# Patient Record
Sex: Male | Born: 1970 | Race: White | Hispanic: No | Marital: Married | Smoking: Never smoker
Health system: Southern US, Community
[De-identification: ages and names within clinical notes are randomized; demographics above are authoritative.]

## PROBLEM LIST (undated history)

## (undated) DIAGNOSIS — I10 Essential (primary) hypertension: Secondary | ICD-10-CM

## (undated) DIAGNOSIS — F419 Anxiety disorder, unspecified: Secondary | ICD-10-CM

## (undated) HISTORY — DX: Essential (primary) hypertension: I10

## (undated) HISTORY — DX: Anxiety disorder, unspecified: F41.9

---

## 2017-04-13 DIAGNOSIS — E669 Obesity, unspecified: Secondary | ICD-10-CM | POA: Insufficient documentation

## 2017-04-13 DIAGNOSIS — I10 Essential (primary) hypertension: Secondary | ICD-10-CM | POA: Insufficient documentation

## 2017-07-03 DIAGNOSIS — F4329 Adjustment disorder with other symptoms: Secondary | ICD-10-CM | POA: Insufficient documentation

## 2018-03-16 ENCOUNTER — Ambulatory Visit (INDEPENDENT_AMBULATORY_CARE_PROVIDER_SITE_OTHER): Payer: 59 | Admitting: Osteopathic Medicine

## 2018-03-16 ENCOUNTER — Encounter: Payer: Self-pay | Admitting: Osteopathic Medicine

## 2018-03-16 VITALS — BP 129/79 | HR 70 | Temp 98.3°F | Ht 73.0 in | Wt 275.0 lb

## 2018-03-16 DIAGNOSIS — I10 Essential (primary) hypertension: Secondary | ICD-10-CM | POA: Diagnosis not present

## 2018-03-16 DIAGNOSIS — F419 Anxiety disorder, unspecified: Secondary | ICD-10-CM | POA: Diagnosis not present

## 2018-03-16 DIAGNOSIS — B078 Other viral warts: Secondary | ICD-10-CM | POA: Diagnosis not present

## 2018-03-16 MED ORDER — LISINOPRIL-HYDROCHLOROTHIAZIDE 20-12.5 MG PO TABS: 1.0000 | ORAL_TABLET | Freq: Every day | ORAL | 3 refills | Status: DC

## 2018-03-16 MED ORDER — ESCITALOPRAM OXALATE 20 MG PO TABS: 20.0000 mg | ORAL_TABLET | Freq: Every day | ORAL | 3 refills | Status: DC

## 2018-03-16 NOTE — Progress Notes (Signed)
HPI: Jeffrey SnellenJames L Dibartolo Jr. is a 47 y.o. male who  has a past medical history of High blood pressure.  he presents to [facility] today, 03/16/18,  for chief complaint of: New to establish HTN Warts on hands  Very pleasant new patient here to establish care.  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  Hypertension: Stable on lisinopril-HCTZ for about a year, tolerating medication well.  No complaints or concerns, no chest pain pressure or shortness of breath.  Anxiety: Stable on S-Citalopram for about a year, tolerating medication well.  No complaints or concerns, no insomnia, agitation.   Wart x2 on right thumb and left middle finger, have been there for a couple years and were frozen by previous PCP but the treatment did not seem to take.  OTC remedies have not been helpful.   Available records reviewed: [facility] with [facility]  Blood pressure at that time 118/76, pulse 86.  Weight 271 pounds.  Medication list included escitalopram 20 mg daily, lisinopril-HCTZ 20-12.5 mg.  Last available labs on file 05/08/2017.  No concerns on CMP other than slightly increased bilirubin, cholesterol borderline goal for hypertension with LDL 113, CBC was okay     Past medical, surgical, social and family history reviewed:  Patient Active Problem List   Diagnosis Date Noted  . Stress and adjustment reaction 07/03/2017  . Hypertension 04/13/2017  . Obesity (BMI 30-39.9) 04/13/2017   History reviewed. No pertinent surgical history.  Social History   Tobacco Use  . Smoking status: Never Smoker  . Smokeless tobacco: Never Used  Substance Use Topics  . Alcohol use: Not Currently    Family History  Problem Relation Age of Onset  . Cancer Father        bladder, mets, agent orange exposure   . Colon cancer Neg Hx   . Prostate cancer Neg Hx      Current medication list and  allergy/intolerance information reviewed:    Current Outpatient Medications  Medication Sig Dispense Refill  . escitalopram (LEXAPRO) 20 MG tablet Take 1 tablet (20 mg total) by mouth daily. 90 tablet 3  . lisinopril-hydrochlorothiazide (PRINZIDE,ZESTORETIC) 20-12.5 MG tablet Take 1 tablet by mouth daily. 90 tablet 3   No current facility-administered medications for this visit.     No Known Allergies    Review of Systems:  Constitutional:  No  fever, no chills, No recent illness, No unintentional weight changes. No significant fatigue.   HEENT: No  headache, no vision change, no hearing change, No sore throat, No  sinus pressure  Cardiac: No  chest pain, No  pressure, No palpitations, No  Orthopnea  Respiratory:  No  shortness of breath. No  Cough  Gastrointestinal: No  abdominal pain, No  nausea, No  vomiting,  No  blood in stool, No  diarrhea, No  constipation   Musculoskeletal: No new myalgia/arthralgia  Skin: No  Rash, No other wounds/concerning lesions  Genitourinary: No  incontinence, No  abnormal genital bleeding, No abnormal genital discharge  Hem/Onc: No  easy bruising/bleeding, No  abnormal lymph node  Endocrine: No cold intolerance,  No heat intolerance. No polyuria/polydipsia/polyphagia   Neurologic: No  weakness, No  dizziness, No  slurred speech/focal weakness/facial droop  Psychiatric: No  concerns with depression, No  concerns with anxiety, No sleep problems, No mood problems  Exam:  BP 129/79 (BP Location: Left Arm, Patient Position: Sitting, Cuff  Size: Large)   Pulse 70   Temp 98.3 F (36.8 C) (Oral)   Ht 6\' 1"  (1.854 m)   Wt 275 lb (124.7 kg)   BMI 36.28 kg/m   Constitutional: VS see above. General Appearance: alert, well-developed, well-nourished, NAD  Eyes: Normal lids and conjunctive, non-icteric sclera  Ears, Nose, Mouth, Throat: MMM, Normal external inspection ears/nares/mouth/lips/gums. TM normal bilaterally. Pharynx/tonsils no erythema,  no exudate. Nasal mucosa normal.   Neck: No masses, trachea midline. No thyroid enlargement. No tenderness/mass appreciated. No lymphadenopathy  Respiratory: Normal respiratory effort. no wheeze, no rhonchi, no rales  Cardiovascular: S1/S2 normal, no murmur, no rub/gallop auscultated. RRR. No lower extremity edema.   Gastrointestinal: Nontender, no masses. No hepatomegaly, no splenomegaly. No hernia appreciated. Bowel sounds normal. Rectal exam deferred.   Musculoskeletal: Gait normal. No clubbing/cyanosis of digits.   Neurological: Normal balance/coordination. No tremor.   Skin: warm, dry, intact.   Psychiatric: Normal judgment/insight. Normal mood and affect. Oriented x3.     ASSESSMENT/PLAN:   Essential hypertension - Plan: CBC, COMPLETE METABOLIC PANEL WITH GFR, Lipid panel, TSH  Anxiety - Plan: CBC, COMPLETE METABOLIC PANEL WITH GFR, Lipid panel, TSH  Other viral warts - Cryotherapy applied x2, patient tolerated procedure well    Patient Instructions  If warts need to be refrozen, please call the office and we can squeeze you in to get these zapped.   We will plan to repeat blood work fasting sometime in November.   Please let us know when you are due for refills of medications.  We will plan to recheck blood pressure/do official annual physical about 6 months from now, but please come see me sooner if needed!       Visit summary with medication list and pertinent instructions was printed for patient to review. All questions at time of visit were answered - patient instructed to contact office with any additional concerns. ER/RTC precautions were reviewed with the patient.   Follow-up plan: Return in about 6 months (around 09/14/2018) for annual check-up, sooner if needed! LAB VISIT ONLY in 04/2018.  Note: Total time spent 30 minutes, greater than 50% of the visit was spent face-to-face counseling and coordinating care for the following: The primary encounter  diagnosis was Essential hypertension. Diagnoses of Anxiety and Other viral warts were also pertinent to this visit.Marland Kitchen  Please note: voice recognition software was used to produce this document, and typos may escape review. Please contact Dr. Lyn Hollingshead for any needed clarifications.

## 2018-03-16 NOTE — Patient Instructions (Addendum)
If warts need to be refrozen, please call the office and we can squeeze you in to get these zapped.   We will plan to repeat blood work fasting sometime in November.   Please let us know when you are due for refills of medications.  We will plan to recheck blood pressure/do official annual physical about 6 months from now, but please come see me sooner if needed!

## 2018-04-12 ENCOUNTER — Encounter: Payer: Self-pay | Admitting: Osteopathic Medicine

## 2018-04-12 ENCOUNTER — Ambulatory Visit (INDEPENDENT_AMBULATORY_CARE_PROVIDER_SITE_OTHER): Payer: 59 | Admitting: Osteopathic Medicine

## 2018-04-12 VITALS — BP 118/74 | HR 93 | Temp 98.5°F | Wt 272.5 lb

## 2018-04-12 DIAGNOSIS — B078 Other viral warts: Secondary | ICD-10-CM

## 2018-04-12 NOTE — Patient Instructions (Signed)
Warts Warts are small growths on the skin. They are common and can occur on various areas of the body. A person may have one wart or multiple warts. Most warts are not painful, and they usually do not cause problems. However, warts can cause pain if they are large or occur in an area of the body where pressure will be applied to them, such as the bottom of the foot. In many cases, warts do not require treatment. They usually go away on their own over a period of many months to a couple years. Various treatments may be done for warts that cause problems or do not go away. Sometimes, warts go away and then come back again. What are the causes? Warts are caused by a type of virus that is called human papillomavirus (HPV). This virus can spread from person to person through direct contact. Warts can also spread to other areas of the body when a person scratches a wart and then scratches another area of his or her body. What increases the risk? Warts are more likely to develop in:  People who are 10-20 years of age.  People who have a weakened body defense system (immune system).  What are the signs or symptoms? A wart may be round or oval or have an irregular shape. Most warts have a rough surface. Warts may range in color from skin color to light yellow, brown, or gray. They are generally less than  inch (1.3 cm) in size. Most warts are painless, but some can be painful when pressure is applied to them. How is this diagnosed? A wart can usually be diagnosed from its appearance. In some cases, a tissue sample may be removed (biopsy) to be looked at under a microscope. How is this treated? In many cases, warts do not need treatment. If treatment is needed, options may include:  Applying medicated solutions, creams, or patches to the wart. These may be over-the-counter or prescription medicines that make the skin soft so that layers will gradually shed away. In many cases, the medicine is applied one  or two times per day and covered with a bandage.  Putting duct tape over the top of the wart (occlusion). You will leave the tape in place for as long as told by your health care provider, then you will replace it with a new strip of tape. This is done until the wart goes away.  Freezing the wart with liquid nitrogen (cryotherapy).  Burning the wart with: ? Laser treatment. ? An electrified probe (electrocautery).  Injection of a medicine (Candida antigen) into the wart to help the body's immune system to fight off the wart.  Surgery to remove the wart.  Follow these instructions at home:  Apply over-the-counter and prescription medicines only as told by your health care provider.  Do not apply over-the-counter wart medicines to your face or genitals before you ask your health care provider if it is okay to do so.  Do not scratch or pick at a wart.  Wash your hands after you touch a wart.  Avoid shaving hair that is over a wart.  Keep all follow-up visits as told by your health care provider. This is important. Contact a health care provider if:  Your warts do not improve after treatment.  You have redness, swelling, or pain at the site of a wart.  You have bleeding from a wart that does not stop with light pressure.  You have diabetes and you develop a   wart. This information is not intended to replace advice given to you by your health care provider. Make sure you discuss any questions you have with your health care provider. Document Released: 03/26/2005 Document Revised: 11/28/2015 Document Reviewed: 09/11/2014 Elsevier Interactive Patient Education  2018 Elsevier Inc.  

## 2018-04-12 NOTE — Progress Notes (Signed)
HPI: Jeffrey Orr. is a 47 y.o. male who  has a past medical history of Anxiety and High blood pressure.  he presents to [facility] today, 04/12/18,  for chief complaint of:  Cryotherapy: wart treatment  Wart x2 on right thumb and left middle finger, have been there for a couple years and were frozen by previous PCP but the treatment did not seem to take. We froze last visit. Thumb wart better but one wart is still present.    Past medical history, surgical history, and family history reviewed.  Current medication list and allergy/intolerance information reviewed.   (See remainder of HPI, ROS, Phys Exam below)    ASSESSMENT/PLAN:   Other viral warts - Cryotherapy applied to left medial thumb, operated procedure well     Follow-up plan: Return if symptoms worsen or fail to improve.                  ############################################ ############################################ ############################################ ############################################    Outpatient Encounter Medications as of 04/12/2018  Medication Sig  . escitalopram (LEXAPRO) 20 MG tablet Take 1 tablet (20 mg total) by mouth daily.  Marland Kitchen lisinopril-hydrochlorothiazide (PRINZIDE,ZESTORETIC) 20-12.5 MG tablet Take 1 tablet by mouth daily.   No facility-administered encounter medications on file as of 04/12/2018.    No Known Allergies    Review of Systems:  Constitutional: No recent illness  Musculoskeletal: No new myalgia/arthralgia  Skin: No  Rash, +wart as per HPI    Exam:  BP 118/74 (BP Location: Left Arm, Patient Position: Sitting, Cuff Size: Large)   Pulse 93   Temp 98.5 F (36.9 C) (Oral)   Wt 272 lb 8 oz (123.6 kg)   BMI 35.95 kg/m   Constitutional: VS see above. General Appearance: alert, well-developed, well-nourished, NAD  Neurological: Normal balance/coordination. No tremor.  Skin: warm, dry,  intact. Viral wart on L thumb  Psychiatric: Normal judgment/insight. Normal mood and affect. Oriented x3.   Visit summary with medication list and pertinent instructions was printed for patient to review, advised to alert [place] if any changes needed. All questions at time of visit were answered - patient instructed to contact office with any additional concerns. ER/RTC precautions were reviewed with the patient and understanding verbalized.   Follow-up plan: Return if symptoms worsen or fail to improve.    Please note: voice recognition software was used to produce this document, and typos may escape review. Please contact Dr. Lyn Hollingshead for any needed clarifications.

## 2018-04-16 ENCOUNTER — Telehealth: Payer: Self-pay | Admitting: Osteopathic Medicine

## 2018-04-16 NOTE — Telephone Encounter (Signed)
Mr. Jeffrey Orr called requesting a follow up for wart freezing. I suggested to move him to another provider to get a sooner appointment. He wanted to keep it with you.   Would you advise we take up a acute spot or push him to a further appointment.

## 2018-04-16 NOTE — Telephone Encounter (Signed)
Pt's appointment can be placed in an acute time slot. The procedure doesn't take long. I'll inform Dr. Lyn Hollingshead of the update. Thanks.

## 2018-04-20 ENCOUNTER — Ambulatory Visit (INDEPENDENT_AMBULATORY_CARE_PROVIDER_SITE_OTHER): Payer: 59 | Admitting: Osteopathic Medicine

## 2018-04-20 ENCOUNTER — Encounter: Payer: Self-pay | Admitting: Osteopathic Medicine

## 2018-04-20 VITALS — BP 126/81 | HR 92 | Temp 98.5°F | Wt 273.4 lb

## 2018-04-20 DIAGNOSIS — B078 Other viral warts: Secondary | ICD-10-CM

## 2018-04-20 NOTE — Progress Notes (Signed)
HPI: Jeffrey Orr. is a 47 y.o. male who  has a past medical history of Anxiety and High blood pressure.  he presents to [facility] today, 04/20/18,  for chief complaint of:  Cryotherapy: wart treatment  Wart x2 on right thumb and left middle finger, have been there for a couple years and were frozen by previous PCP but the treatment did not seem to take. We froze both 03/16/18, we re-froze a persistent thumb wart last visit 10/14/19t. One wart better but one wart is still present.    Past medical history, surgical history, and family history reviewed.  Current medication list and allergy/intolerance information reviewed.   (See remainder of HPI, ROS, Phys Exam below)    ASSESSMENT/PLAN:   Other viral warts - Cryotherapy applied, patient tolerated procedure well.  Let us give it a couple weeks to see if new skin heals up normally.  Follow-up PRN                       ############################################ ############################################ ############################################ ############################################    Outpatient Encounter Medications as of 04/20/2018  Medication Sig  . escitalopram (LEXAPRO) 20 MG tablet Take 1 tablet (20 mg total) by mouth daily.  Marland Kitchen lisinopril-hydrochlorothiazide (PRINZIDE,ZESTORETIC) 20-12.5 MG tablet Take 1 tablet by mouth daily.   No facility-administered encounter medications on file as of 04/20/2018.    No Known Allergies    Review of Systems:  Constitutional: No recent illness  Musculoskeletal: No new myalgia/arthralgia  Skin: No  Rash, +wart as per HPI    Exam:  BP 126/81 (BP Location: Left Arm, Patient Position: Sitting, Cuff Size: Large)   Pulse 92   Temp 98.5 F (36.9 C) (Oral)   Wt 273 lb 6.4 oz (124 kg)   BMI 36.07 kg/m   Constitutional: VS see above. General Appearance: alert, well-developed, well-nourished,  NAD  Neurological: Normal balance/coordination. No tremor.  Skin: warm, dry, intact. Viral wart on L thumb  Psychiatric: Normal judgment/insight. Normal mood and affect. Oriented x3.   Visit summary with medication list and pertinent instructions was printed for patient to review, advised to alert [place] if any changes needed. All questions at time of visit were answered - patient instructed to contact office with any additional concerns. ER/RTC precautions were reviewed with the patient and understanding verbalized.       Please note: voice recognition software was used to produce this document, and typos may escape review. Please contact Dr. Lyn Hollingshead for any needed clarifications.

## 2018-05-21 ENCOUNTER — Ambulatory Visit (INDEPENDENT_AMBULATORY_CARE_PROVIDER_SITE_OTHER): Payer: 59 | Admitting: Osteopathic Medicine

## 2018-05-21 DIAGNOSIS — B078 Other viral warts: Secondary | ICD-10-CM | POA: Diagnosis not present

## 2018-05-21 NOTE — Progress Notes (Signed)
HPI: Jeffrey SnellenJames L Waszak Jr. is a 47 y.o. male who  has a past medical history of Anxiety and High blood pressure.  [facility] today, 04/20/18,  for chief complaint of:  Cryotherapy: wart treatment  Wart x2 on right thumb and left middle finger, have been there for a couple years and were frozen by previous PCP but the treatment did not seem to take. We froze both 03/16/18, we re-froze a persistent thumb wart 04/12/18 and 04/20/18 - here today, 05/21/18 for repeat treatment - wart is better but persistent.    Past medical history, surgical history, and family history reviewed.  Current medication list and allergy/intolerance information reviewed.   (See remainder of HPI, ROS, Phys Exam below)    ASSESSMENT/PLAN:   Other viral warts - Cryotherapy applied, patient tolerated procedure well.  Let us give it a couple weeks to see if new skin heals up normally.  Follow-up PRN                       ############################################ ############################################ ############################################ ############################################    Outpatient Encounter Medications as of 04/20/2018  Medication Sig  . escitalopram (LEXAPRO) 20 MG tablet Take 1 tablet (20 mg total) by mouth daily.  Marland Kitchen. lisinopril-hydrochlorothiazide (PRINZIDE,ZESTORETIC) 20-12.5 MG tablet Take 1 tablet by mouth daily.   No facility-administered encounter medications on file as of 04/20/2018.    No Known Allergies    Review of Systems:  Constitutional: No recent illness  Musculoskeletal: No new myalgia/arthralgia  Skin: No  Rash, +wart as per HPI    Exam:   Constitutional: VS see above. General Appearance: alert, well-developed, well-nourished, NAD  Neurological: Normal balance/coordination. No tremor.  Skin: warm, dry, intact. Viral wart on L thumb appears smaller than previous  Psychiatric:  Normal judgment/insight. Normal mood and affect. Oriented x3.   Visit summary with medication list and pertinent instructions was printed for patient to review, advised to alert us if any changes needed. All questions at time of visit were answered - patient instructed to contact office with any additional concerns. ER/RTC precautions were reviewed with the patient and understanding verbalized.       Please note: voice recognition software was used to produce this document, and typos may escape review. Please contact Dr. Lyn HollingsheadAlexander for any needed clarifications.

## 2018-07-28 ENCOUNTER — Other Ambulatory Visit: Payer: Self-pay

## 2018-07-28 MED ORDER — ESCITALOPRAM OXALATE 20 MG PO TABS: 20.0000 mg | ORAL_TABLET | Freq: Every day | ORAL | 3 refills | Status: DC

## 2018-07-28 MED ORDER — LISINOPRIL-HYDROCHLOROTHIAZIDE 20-12.5 MG PO TABS: 1.0000 | ORAL_TABLET | Freq: Every day | ORAL | 3 refills | Status: DC

## 2018-09-15 ENCOUNTER — Other Ambulatory Visit: Payer: Self-pay

## 2018-09-15 ENCOUNTER — Encounter: Payer: Self-pay | Admitting: Osteopathic Medicine

## 2018-09-15 ENCOUNTER — Ambulatory Visit (INDEPENDENT_AMBULATORY_CARE_PROVIDER_SITE_OTHER): Payer: 59 | Admitting: Osteopathic Medicine

## 2018-09-15 VITALS — BP 124/68 | HR 75 | Temp 98.2°F | Wt 280.7 lb

## 2018-09-15 DIAGNOSIS — I10 Essential (primary) hypertension: Secondary | ICD-10-CM

## 2018-09-15 DIAGNOSIS — Z0000 Encounter for general adult medical examination without abnormal findings: Secondary | ICD-10-CM

## 2018-09-15 NOTE — Patient Instructions (Addendum)
General Preventive Care  Most recent routine screening lipids/other labs: ordered today!   Everyone should have blood pressure checked once per year.   Tobacco: don't!   Alcohol: responsible moderation is ok for most adults - if you have concerns about your alcohol intake, please talk to me!   Exercise: as tolerated to reduce risk of cardiovascular disease and diabetes. Strength training will also prevent osteoporosis.   Mental health: if need for mental health care (medicines, counseling, other), or concerns about moods, please let me know!   Sexual health: if need for STD testing, or if concerns with libido/pain problems, please let me know!  Advanced Directive: Living Will and/or Healthcare Power of Attorney recommended for all adults, regardless of age or health.  Vaccines  Flu vaccine: recommended for almost everyone, every fall.   Shingles vaccine: Shingrix recommended after age 48  Pneumonia vaccines: Prevnar and Pneumovax recommended after age 89.  Tetanus booster: Tdap recommended every 10 years. Due 08/2023 Cancer screenings   Colon cancer screening: recommended for everyone at age 50, but some folks need a colonoscopy sooner if risk factors   Prostate cancer screening: PSA blood test around age 35  Lung cancer screening: not needed for non-smokers  Infection screenings . HIV, Gonorrhea/Chlamydia: screening as needed.  . Hepatitis C: recommended for anyone born 32-1965 = not needed for you . TB: certain at-risk populations, or depending on work requirements and/or travel history Other . Bone Density Test: recommended for men at age 50, sooner depending on risk factors . Abdominal Aortic Aneurysm: not needed for non-smokers      For sinus/ears: Flonase or Nasonex nasal spray +/- antihistamines Claritin, Zyrtec, Allegra OR Claritin-D, Zyrtec-D, Allegra-D.   For back:  See printed info OK to use Motrin/ibuprofen, Tylenol/acetaminophen, Aleve/naproxen.

## 2018-09-15 NOTE — Progress Notes (Signed)
HPI: Jeffrey Orr. is a 48 y.o. male who  has a past medical history of Anxiety and High blood pressure.  he presents to [facility] today, 09/15/18,  for chief complaint of: Annual physical     Patient here for annual physical / wellness exam.  See preventive care reviewed as below.    Additional concerns today include:  Low back pain on and off TInnitus in L ear a bit worse lately, no hearing loss      Past medical, surgical, social and family history reviewed:  Patient Active Problem List   Diagnosis Date Noted  . Stress and adjustment reaction 07/03/2017  . Hypertension 04/13/2017  . Obesity (BMI 30-39.9) 04/13/2017    No past surgical history on file.  Social History   Tobacco Use  . Smoking status: Never Smoker  . Smokeless tobacco: Never Used  Substance Use Topics  . Alcohol use: Not Currently    Family History  Problem Relation Age of Onset  . Cancer Father        bladder, mets, agent orange exposure   . Colon cancer Neg Hx   . Prostate cancer Neg Hx      Current medication list and allergy/intolerance information reviewed:    Current Outpatient Medications  Medication Sig Dispense Refill  . escitalopram (LEXAPRO) 20 MG tablet Take 1 tablet (20 mg total) by mouth daily. 90 tablet 3  . lisinopril-hydrochlorothiazide (PRINZIDE,ZESTORETIC) 20-12.5 MG tablet Take 1 tablet by mouth daily. 90 tablet 3   No current facility-administered medications for this visit.     No Known Allergies    Review of Systems:  Constitutional:  No  fever, no chills, No recent illness, No unintentional weight changes. No significant fatigue.   HEENT: No  headache, no vision change, no hearing change, No sore throat, No  sinus pressure  Cardiac: No  chest pain, No  pressure, No palpitations, No  Orthopnea  Respiratory:  No  shortness of breath. No  Cough  Gastrointestinal: No  abdominal pain, No  nausea, No  vomiting,  No   blood in stool, No  diarrhea, No  constipation   Musculoskeletal: No new myalgia/arthralgia  Skin: No  Rash, No other wounds/concerning lesions  Genitourinary: No  incontinence, No  abnormal genital bleeding, No abnormal genital discharge  Hem/Onc: No  easy bruising/bleeding, No  abnormal lymph node  Endocrine: No cold intolerance,  No heat intolerance. No polyuria/polydipsia/polyphagia   Neurologic: No  weakness, No  dizziness, No  slurred speech/focal weakness/facial droop  Psychiatric: No  concerns with depression, No  concerns with anxiety, No sleep problems, No mood problems  Exam:  BP 124/68 (BP Location: Left Arm, Patient Position: Sitting, Cuff Size: Normal)   Pulse 75   Temp 98.2 F (36.8 C) (Oral)   Wt 280 lb 11.2 oz (127.3 kg)   BMI 37.03 kg/m   Constitutional: VS see above. General Appearance: alert, well-developed, well-nourished, NAD  Eyes: Normal lids and conjunctive, non-icteric sclera  Ears, Nose, Mouth, Throat: MMM, Normal external inspection ears/nares/mouth/lips/gums. TM normal bilaterally. Pharynx/tonsils no erythema, no exudate. Nasal mucosa normal.   Neck: No masses, trachea midline. No thyroid enlargement. No tenderness/mass appreciated. No lymphadenopathy  Respiratory: Normal respiratory effort. no wheeze, no rhonchi, no rales  Cardiovascular: S1/S2 normal, no murmur, no rub/gallop auscultated. RRR. No lower extremity edema.   Gastrointestinal: Nontender, no masses. No hepatomegaly, no splenomegaly. No hernia appreciated. Bowel sounds normal. Rectal exam deferred.   Musculoskeletal:  Gait normal. No clubbing/cyanosis of digits.   Neurological: Normal balance/coordination. No tremor. No cranial nerve deficit on limited exam. Motor and sensation intact and symmetric. Cerebellar reflexes intact.   Skin: warm, dry, intact. No rash/ulcer. No concerning nevi or subq nodules on limited exam.    Psychiatric: Normal judgment/insight. Normal mood and  affect. Oriented x3.      ASSESSMENT/PLAN: The primary encounter diagnosis was Annual physical exam. A diagnosis of Essential hypertension was also pertinent to this visit.    Patient Instructions  General Preventive Care  Most recent routine screening lipids/other labs: ordered today!   Everyone should have blood pressure checked once per year.   Tobacco: don't!   Alcohol: responsible moderation is ok for most adults - if you have concerns about your alcohol intake, please talk to me!   Exercise: as tolerated to reduce risk of cardiovascular disease and diabetes. Strength training will also prevent osteoporosis.   Mental health: if need for mental health care (medicines, counseling, other), or concerns about moods, please let me know!   Sexual health: if need for STD testing, or if concerns with libido/pain problems, please let me know!  Advanced Directive: Living Will and/or Healthcare Power of Attorney recommended for all adults, regardless of age or health.  Vaccines  Flu vaccine: recommended for almost everyone, every fall.   Shingles vaccine: Shingrix recommended after age 11  Pneumonia vaccines: Prevnar and Pneumovax recommended after age 72.  Tetanus booster: Tdap recommended every 10 years. Due 08/2023 Cancer screenings   Colon cancer screening: recommended for everyone at age 8, but some folks need a colonoscopy sooner if risk factors   Prostate cancer screening: PSA blood test around age 23  Lung cancer screening: not needed for non-smokers  Infection screenings . HIV, Gonorrhea/Chlamydia: screening as needed.  . Hepatitis C: recommended for anyone born [birth year]-1965 = not needed for you . TB: certain at-risk populations, or depending on work requirements and/or travel history Other . Bone Density Test: recommended for men at age 58, sooner depending on risk factors . Abdominal Aortic Aneurysm: not needed for non-smokers      For sinus/ears: Flonase or  Nasonex nasal spray +/- antihistamines Claritin, Zyrtec, Allegra OR Claritin-D, Zyrtec-D, Allegra-D.   For back:  See printed info OK to use Motrin/ibuprofen, Tylenol/acetaminophen, Aleve/naproxen.          Visit summary with medication list and pertinent instructions was printed for patient to review. All questions at time of visit were answered - patient instructed to contact office with any additional concerns or updates. ER/RTC precautions were reviewed with the patient.     Please note: voice recognition software was used to produce this document, and typos may escape review. Please contact Dr. Lyn Hollingshead for any needed clarifications.     Follow-up plan: Return in about 1 year (around 09/15/2019) for ANNUAL PHYSICAL .

## 2018-09-18 LAB — COMPLETE METABOLIC PANEL WITH GFR
AG RATIO: 1.8 (calc) (ref 1.0–2.5)
ALT: 31 U/L (ref 9–46)
AST: 21 U/L (ref 10–40)
Albumin: 4.4 g/dL (ref 3.6–5.1)
Alkaline phosphatase (APISO): 67 U/L (ref 36–130)
BUN: 13 mg/dL (ref 7–25)
CALCIUM: 9.5 mg/dL (ref 8.6–10.3)
CO2: 27 mmol/L (ref 20–32)
CREATININE: 1.07 mg/dL (ref 0.60–1.35)
Chloride: 104 mmol/L (ref 98–110)
GFR, EST NON AFRICAN AMERICAN: 82 mL/min/{1.73_m2} (ref >=60)
GFR, Est African American: 95 mL/min/{1.73_m2} (ref >=60)
Globulin: 2.4 g/dL (calc) (ref 1.9–3.7)
Glucose, Bld: 110 mg/dL — ABNORMAL HIGH (ref 65–99)
POTASSIUM: 4.2 mmol/L (ref 3.5–5.3)
SODIUM: 137 mmol/L (ref 135–146)
Total Bilirubin: 0.8 mg/dL (ref 0.2–1.2)
Total Protein: 6.8 g/dL (ref 6.1–8.1)

## 2018-09-18 LAB — CBC
HEMATOCRIT: 42.9 % (ref 38.5–50.0)
Hemoglobin: 15.2 g/dL (ref 13.2–17.1)
MCH: 32.5 pg (ref 27.0–33.0)
MCHC: 35.4 g/dL (ref 32.0–36.0)
MCV: 91.9 fL (ref 80.0–100.0)
MPV: 9.8 fL (ref 7.5–12.5)
PLATELETS: 204 10*3/uL (ref 140–400)
RBC: 4.67 10*6/uL (ref 4.20–5.80)
RDW: 12.3 % (ref 11.0–15.0)
WBC: 5.2 10*3/uL (ref 3.8–10.8)

## 2018-09-18 LAB — TEST AUTHORIZATION

## 2018-09-18 LAB — LIPID PANEL
CHOL/HDL RATIO: 4.4 (calc) (ref <5.0)
Cholesterol: 163 mg/dL (ref <200)
HDL: 37 mg/dL — ABNORMAL LOW (ref >=40)
LDL Cholesterol (Calc): 101 mg/dL (calc) — ABNORMAL HIGH
NON-HDL CHOLESTEROL (CALC): 126 mg/dL (ref <130)
Triglycerides: 155 mg/dL — ABNORMAL HIGH (ref <150)

## 2018-09-18 LAB — HEMOGLOBIN A1C W/OUT EAG: Hgb A1c MFr Bld: 5.7 % of total Hgb — ABNORMAL HIGH (ref <5.7)

## 2018-09-18 LAB — TSH: TSH: 1.73 m[IU]/L (ref 0.40–4.50)

## 2019-04-11 ENCOUNTER — Ambulatory Visit: Payer: 59 | Admitting: Family Medicine

## 2019-07-13 ENCOUNTER — Other Ambulatory Visit: Payer: Self-pay | Admitting: Osteopathic Medicine

## 2019-07-13 NOTE — Telephone Encounter (Signed)
Requested medication (s) are due for refill today: yes  Requested medication (s) are on the active medication list: yes  Last refill:  04/19/2019  Future visit scheduled: yes  Notes to clinic:  review for refill   Requested Prescriptions  Pending Prescriptions Disp Refills   escitalopram (LEXAPRO) 20 MG tablet [Pharmacy Med Name: ESCITALOPRAM 20 MG TABLET] 90 tablet 3    Sig: TAKE 1 TABLET BY MOUTH EVERY DAY      Psychiatry:  Antidepressants - SSRI Failed - 07/13/2019  1:25 AM      Failed - Valid encounter within last 6 months    Recent Outpatient Visits           10 months ago Annual physical exam   [place] [facility], Dorene Grebe, DO   1 year ago Other viral warts   [place] [facility], Dorene Grebe, DO   1 year ago Other viral warts   [place] [facility], Dorene Grebe, DO   1 year ago Other viral warts   [place] [facility], Dorene Grebe, DO   1 year ago Essential hypertension   [place] [facility], Dorene Grebe, DO       Future Appointments             In 2 months Sunnie Nielsen, DO [place] [facility]              lisinopril-hydrochlorothiazide (ZESTORETIC) 20-12.5 MG tablet [Pharmacy Med Name: LISINOPRIL-HCTZ 20-12.5 MG TAB] 90 tablet 3    Sig: TAKE 1 TABLET BY MOUTH EVERY DAY      Cardiovascular:  ACEI + Diuretic Combos Failed - 07/13/2019  1:25 AM      Failed - Na in normal range and within 180 days    Sodium  Date Value Ref Range Status  09/16/2018 137 135 - 146 mmol/L Final          Failed - K in normal range and within 180 days    Potassium  Date Value Ref Range Status  09/16/2018 4.2 3.5 - 5.3 mmol/L Final          Failed - Cr in normal range and within 180 days    Creat  Date Value Ref Range Status  09/16/2018 1.07 0.60 - 1.35 mg/dL Final          Failed - Ca in normal range and within 180 days    Calcium  Date Value Ref Range Status  09/16/2018 9.5 8.6 - 10.3 mg/dL Final          Failed - Valid encounter within last 6 months    Recent Outpatient Visits           10 months ago Annual physical exam   [place] [facility], Dorene Grebe, DO   1 year ago Other viral warts   [place] [facility], Dorene Grebe, DO   1 year ago Other viral warts   [place] [facility], Dorene Grebe, DO   1 year ago Other viral warts   [place] [facility], Dorene Grebe, DO   1 year ago Essential hypertension    [facility], Dorene Grebe, DO       Future Appointments  In 2 months Emeterio Reeve, DO [place] [facility] - Patient is not pregnant      Passed - Last BP in normal range    BP Readings from Last 1 Encounters:  09/15/18 124/68

## 2019-09-19 ENCOUNTER — Ambulatory Visit (INDEPENDENT_AMBULATORY_CARE_PROVIDER_SITE_OTHER): Payer: 59 | Admitting: Osteopathic Medicine

## 2019-09-19 ENCOUNTER — Other Ambulatory Visit: Payer: Self-pay

## 2019-09-19 ENCOUNTER — Encounter: Payer: Self-pay | Admitting: Osteopathic Medicine

## 2019-09-19 VITALS — BP 127/82 | HR 68 | Ht 73.0 in | Wt 281.0 lb

## 2019-09-19 DIAGNOSIS — I10 Essential (primary) hypertension: Secondary | ICD-10-CM

## 2019-09-19 DIAGNOSIS — Z1211 Encounter for screening for malignant neoplasm of colon: Secondary | ICD-10-CM

## 2019-09-19 DIAGNOSIS — R7309 Other abnormal glucose: Secondary | ICD-10-CM | POA: Diagnosis not present

## 2019-09-19 DIAGNOSIS — F419 Anxiety disorder, unspecified: Secondary | ICD-10-CM

## 2019-09-19 DIAGNOSIS — Z0000 Encounter for general adult medical examination without abnormal findings: Secondary | ICD-10-CM | POA: Diagnosis not present

## 2019-09-19 MED ORDER — ESCITALOPRAM OXALATE 20 MG PO TABS: 20.0000 mg | ORAL_TABLET | Freq: Every day | ORAL | 3 refills | Status: DC

## 2019-09-19 MED ORDER — LISINOPRIL-HYDROCHLOROTHIAZIDE 20-12.5 MG PO TABS: 1.0000 | ORAL_TABLET | Freq: Every day | ORAL | 3 refills | Status: DC

## 2019-09-19 NOTE — Patient Instructions (Addendum)
General Preventive Care  Most recent routine screening lipids/other labs: ordered today!   Everyone should have blood pressure checked once per year.   Tobacco: don't!   Alcohol: responsible moderation is ok for most adults - if you have concerns about your alcohol intake, please talk to me!   Exercise: as tolerated to reduce risk of cardiovascular disease and diabetes. Strength training will also prevent osteoporosis.   Mental health: if need for mental health care (medicines, counseling, other), or concerns about moods, please let me know!   Sexual health: if need for STD testing, or if concerns with libido/pain problems, please let me know!  Advanced Directive: Living Will and/or Healthcare Power of Attorney recommended for all adults, regardless of age or health.  Vaccines  Flu vaccine: recommended for almost everyone, every fall.   Shingles vaccine: Shingrix recommended age 17  Pneumonia vaccines: Prevnar and Pneumovax recommended after age 45.  Tetanus booster: Tdap recommended every 10 years. Due 08/2023  COVID: recommended as soon as you're eligible! Please follow [email or web address] for updates on eligibility and vaccination appointment. As of now, we do not anticipate our clinic having vaccines anytime soon.  Cancer screenings   Colon cancer screening: recommended for everyone at age 55  Prostate cancer screening: PSA blood test age 53  Lung cancer screening: not needed for non-smokers  Infection screenings  HIV, Gonorrhea/Chlamydia: screening as needed.   Hepatitis C: recommended for anyone born 63-1965 = not needed for you  TB: certain at-risk populations, or depending on work requirements and/or travel history Other  Bone Density Test: recommended for men at age 106, sooner depending on risk factors  Abdominal Aortic Aneurysm: not needed for non-smokers

## 2019-09-19 NOTE — Progress Notes (Signed)
HPI: Jeffrey Orr. is a 49 y.o. male who  has a past medical history of Anxiety and High blood pressure.  he presents to [facility] today, 09/19/19,  for chief complaint of: Annual physical     Patient here for annual physical / wellness exam.  See preventive care reviewed as below.    Additional concerns today include:  Ear pressure and allergies coming on. Sudafed and Zyrtec doing ok      Past medical, surgical, social and family history reviewed:  Patient Active Problem List   Diagnosis Date Noted  . Stress and adjustment reaction 07/03/2017  . Hypertension 04/13/2017  . Obesity (BMI 30-39.9) 04/13/2017    No past surgical history on file.  Social History   Tobacco Use  . Smoking status: Never Smoker  . Smokeless tobacco: Never Used  Substance Use Topics  . Alcohol use: Not Currently    Family History  Problem Relation Age of Onset  . Cancer Father        bladder, mets, agent orange exposure   . Colon cancer Neg Hx   . Prostate cancer Neg Hx      Current medication list and allergy/intolerance information reviewed:    Current Outpatient Medications  Medication Sig Dispense Refill  . escitalopram (LEXAPRO) 20 MG tablet Take 1 tablet (20 mg total) by mouth daily. 90 tablet 3  . lisinopril-hydrochlorothiazide (ZESTORETIC) 20-12.5 MG tablet Take 1 tablet by mouth daily. 90 tablet 3   No current facility-administered medications for this visit.    No Known Allergies    Review of Systems:  Constitutional:  No  fever, no chills, No recent illness  HEENT: No  headache, no vision change, no hearing change, No sore throat, No  sinus pressure  Cardiac: No  chest pain, No  pressure, No palpitations, No  Orthopnea  Respiratory:  No  shortness of breath. No  Cough  Gastrointestinal: No  abdominal pain, No  nausea, No  vomiting,  No  blood in stool, No  diarrhea, No  constipation   Musculoskeletal: No new  myalgia/arthralgia  Skin: No  Rash, No other wounds/concerning lesions  Genitourinary: No  incontinence, No  abnormal genital bleeding, No abnormal genital discharge  Hem/Onc: No  easy bruising/bleeding, No  abnormal lymph node  Endocrine: No cold intolerance,  No heat intolerance. No polyuria/polydipsia/polyphagia   Neurologic: No  weakness, No  dizziness, No  slurred speech/focal weakness/facial droop  Psychiatric: No  concerns with depression, No  concerns with anxiety, No sleep problems, No mood problems  Exam:  BP 127/82   Pulse 68   Ht 6\' 1"  (1.854 m)   Wt 281 lb (127.5 kg)   BMI 37.07 kg/m   Constitutional: VS see above. General Appearance: alert, well-developed, well-nourished, NAD  Eyes: Normal lids and conjunctive, non-icteric sclera  Ears, Nose, Mouth, Throat: MMM, Normal external inspection ears/nares/mouth/lips/gums. TM normal bilaterally. Pharynx/tonsils no erythema, no exudate. Nasal mucosa normal.   Neck: No masses, trachea midline. No thyroid enlargement. No tenderness/mass appreciated. No lymphadenopathy  Respiratory: Normal respiratory effort. no wheeze, no rhonchi, no rales  Cardiovascular: S1/S2 normal, no murmur, no rub/gallop auscultated. RRR. No lower extremity edema.   Gastrointestinal: Nontender, no masses. No hepatomegaly, no splenomegaly. No hernia appreciated. Bowel sounds normal. Rectal exam deferred.   Musculoskeletal: Gait normal. No clubbing/cyanosis of digits.   Neurological: Normal balance/coordination. No tremor. No cranial nerve deficit on limited exam. Motor and sensation intact and symmetric. Cerebellar reflexes  intact.   Skin: warm, dry, intact. No rash/ulcer. No concerning nevi or subq nodules on limited exam.    Psychiatric: Normal judgment/insight. Normal mood and affect. Oriented x3.      ASSESSMENT/PLAN: The primary encounter diagnosis was Annual physical exam. Diagnoses of Essential hypertension, Anxiety, Elevated hemoglobin  A1c measurement, and Colon cancer screening were also pertinent to this visit.    Patient Instructions  General Preventive Care  Most recent routine screening lipids/other labs: ordered today!   Everyone should have blood pressure checked once per year.   Tobacco: don't!   Alcohol: responsible moderation is ok for most adults - if you have concerns about your alcohol intake, please talk to me!   Exercise: as tolerated to reduce risk of cardiovascular disease and diabetes. Strength training will also prevent osteoporosis.   Mental health: if need for mental health care (medicines, counseling, other), or concerns about moods, please let me know!   Sexual health: if need for STD testing, or if concerns with libido/pain problems, please let me know!  Advanced Directive: Living Will and/or Healthcare Power of Attorney recommended for all adults, regardless of age or health.  Vaccines  Flu vaccine: recommended for almost everyone, every fall.   Shingles vaccine: Shingrix recommended age 78  Pneumonia vaccines: Prevnar and Pneumovax recommended after age 61.  Tetanus booster: Tdap recommended every 10 years. Due 08/2023  COVID: recommended as soon as you're eligible! Please follow [email or web address] for updates on eligibility and vaccination appointment. As of now, we do not anticipate our clinic having vaccines anytime soon.  Cancer screenings   Colon cancer screening: recommended for everyone at age 59 - referral placed 09/19/19 for GI --> colonoscopy   Prostate cancer screening: PSA blood test age 70  Lung cancer screening: not needed for non-smokers  Infection screenings  HIV, Gonorrhea/Chlamydia: screening as needed.   Hepatitis C: recommended for anyone born 80-1965 = not needed for you  TB: certain at-risk populations, or depending on work requirements and/or travel history Other  Bone Density Test: recommended for men at age 74, sooner depending on risk  factors  Abdominal Aortic Aneurysm: not needed for non-smokers        Visit summary with medication list and pertinent instructions was printed for patient to review. All questions at time of visit were answered - patient instructed to contact office with any additional concerns or updates. ER/RTC precautions were reviewed with the patient.     Please note: voice recognition software was used to produce this document, and typos may escape review. Please contact Dr. Lyn Hollingshead for any needed clarifications.     Follow-up plan: Return in about 1 year (around 09/18/2020) for [place] (call week prior to visit for lab orders).

## 2019-09-20 LAB — COMPLETE METABOLIC PANEL WITH GFR
AG Ratio: 2 (calc) (ref 1.0–2.5)
ALT: 24 U/L (ref 9–46)
AST: 18 U/L (ref 10–40)
Albumin: 4.5 g/dL (ref 3.6–5.1)
Alkaline phosphatase (APISO): 74 U/L (ref 36–130)
BUN: 13 mg/dL (ref 7–25)
CO2: 26 mmol/L (ref 20–32)
Calcium: 9.3 mg/dL (ref 8.6–10.3)
Chloride: 107 mmol/L (ref 98–110)
Creat: 0.98 mg/dL (ref 0.60–1.35)
GFR, Est African American: 105 mL/min/{1.73_m2} (ref >=60)
GFR, Est Non African American: 91 mL/min/{1.73_m2} (ref >=60)
Globulin: 2.3 g/dL (calc) (ref 1.9–3.7)
Glucose, Bld: 105 mg/dL — ABNORMAL HIGH (ref 65–99)
Potassium: 4.2 mmol/L (ref 3.5–5.3)
Sodium: 139 mmol/L (ref 135–146)
Total Bilirubin: 0.7 mg/dL (ref 0.2–1.2)
Total Protein: 6.8 g/dL (ref 6.1–8.1)

## 2019-09-20 LAB — LIPID PANEL
Cholesterol: 163 mg/dL (ref <200)
HDL: 37 mg/dL — ABNORMAL LOW (ref >=40)
LDL Cholesterol (Calc): 104 mg/dL (calc) — ABNORMAL HIGH
Non-HDL Cholesterol (Calc): 126 mg/dL (calc) (ref <130)
Total CHOL/HDL Ratio: 4.4 (calc) (ref <5.0)
Triglycerides: 127 mg/dL (ref <150)

## 2019-09-20 LAB — CBC
HCT: 44.1 % (ref 38.5–50.0)
Hemoglobin: 15.5 g/dL (ref 13.2–17.1)
MCH: 32.5 pg (ref 27.0–33.0)
MCHC: 35.1 g/dL (ref 32.0–36.0)
MCV: 92.5 fL (ref 80.0–100.0)
MPV: 9.6 fL (ref 7.5–12.5)
Platelets: 212 10*3/uL (ref 140–400)
RBC: 4.77 10*6/uL (ref 4.20–5.80)
RDW: 12.1 % (ref 11.0–15.0)
WBC: 5.2 10*3/uL (ref 3.8–10.8)

## 2019-09-20 LAB — HEMOGLOBIN A1C
Hgb A1c MFr Bld: 5.6 % of total Hgb (ref <5.7)
Mean Plasma Glucose: 114 (calc)
eAG (mmol/L): 6.3 (calc)

## 2019-11-23 ENCOUNTER — Encounter: Payer: Self-pay | Admitting: Osteopathic Medicine

## 2020-07-10 ENCOUNTER — Encounter: Payer: Self-pay | Admitting: Physician Assistant

## 2020-07-10 ENCOUNTER — Telehealth (INDEPENDENT_AMBULATORY_CARE_PROVIDER_SITE_OTHER): Payer: Managed Care, Other (non HMO) | Admitting: Physician Assistant

## 2020-07-10 VITALS — Wt 276.0 lb

## 2020-07-10 DIAGNOSIS — Z20822 Contact with and (suspected) exposure to covid-19: Secondary | ICD-10-CM

## 2020-07-10 DIAGNOSIS — R0602 Shortness of breath: Secondary | ICD-10-CM

## 2020-07-10 MED ORDER — ALBUTEROL SULFATE HFA 108 (90 BASE) MCG/ACT IN AERS: 2.0000 | INHALATION_SPRAY | Freq: Four times a day (QID) | RESPIRATORY_TRACT | 0 refills | Status: DC | PRN

## 2020-07-10 NOTE — Progress Notes (Signed)
Patient ID: Jeffrey Snellen., male   DOB: Jan 11, 1971, 50 y.o.   MRN: [id number] .Marland KitchenVirtual Visit via Video Note  I connected with Jeffrey Snellen. on 07/10/2020 at  4:20 PM EST by a video enabled telemedicine application and verified that I am speaking with the correct person using two identifiers.  Location: Patient: home Provider: clinic  .Marland KitchenParticipating in visit:  Patient: Jeffrey Orr Provider: Tandy Gaw PA-C   I discussed the limitations of evaluation and management by telemedicine and the availability of in person appointments. The patient expressed understanding and agreed to proceed.  History of Present Illness: Pt is a 50 yo male with HTN  who presents to the clinic with runny nose, SOB, cough, chest congestion, dry mouth. He was exposed to covid at work. He has not tested himself. He has been vaccinated with booster. No loss of smell or taste, GI symptoms, chills, fever.     .. Active Ambulatory Problems    Diagnosis Date Noted  . Hypertension 04/13/2017  . Obesity (BMI 30-39.9) 04/13/2017  . Stress and adjustment reaction 07/03/2017   Resolved Ambulatory Problems    Diagnosis Date Noted  . No Resolved Ambulatory Problems   Past Medical History:  Diagnosis Date  . Anxiety   . High blood pressure    Reviewed med, allergy, problem list.     Observations/Objective: No acute distress Dry cough No wheezing or labored breathing Mood normal  .. Today's Vitals   07/10/20 1536  Weight: 276 lb (125.2 kg)   Body mass index is 36.41 kg/m.    Assessment and Plan: Marland KitchenMarland KitchenDiagnoses and all orders for this visit:  Suspected COVID-19 virus infection  Close exposure to COVID-19 virus  SOB (shortness of breath) -     albuterol (VENTOLIN HFA) 108 (90 Base) MCG/ACT inhaler; Inhale 2 puffs into the lungs every 6 (six) hours as needed.  will test for covid in the am.  Discussed quarantine for 5 days and asymptomatic or until negative covid.   Discussed symptomatic care and vitamins.  .Vitamin D3 5000 IU (125 mcg) daily Vitamin C 500 mg twice daily Zinc 50 to 75 mg daily  Albuterol sent for SOB and breathing. If any sudden worsening of symptoms  Letter sent to email. [email or web address]  Follow Up Instructions:    I discussed the assessment and treatment plan with the patient. The patient was provided an opportunity to ask questions and all were answered. The patient agreed with the plan and demonstrated an understanding of the instructions.   The patient was advised to call back or seek an in-person evaluation if the symptoms worsen or if the condition fails to improve as anticipated.    Tandy Gaw, PA-C

## 2020-07-10 NOTE — Progress Notes (Signed)
Pt stated on monday started having runny nose, congestion, dry mouth and coughing yesterday, he took claritin and sudafed OTC medication  pt. did not test himself for covid.

## 2020-07-11 ENCOUNTER — Other Ambulatory Visit: Payer: Self-pay

## 2020-07-11 DIAGNOSIS — Z20822 Contact with and (suspected) exposure to covid-19: Secondary | ICD-10-CM

## 2020-07-11 NOTE — Progress Notes (Unsigned)
NA

## 2020-07-14 LAB — SARS-COV-2, NAA 2 DAY TAT

## 2020-07-14 LAB — NOVEL CORONAVIRUS, NAA: SARS-CoV-2, NAA: DETECTED — AB

## 2020-07-15 NOTE — Progress Notes (Signed)
Your are positive for covid. Treatment plan stays the same. Quarantine for 5 days from symptoms. If still symptomatic quarantine another 5 days. Continue to treat symptoms.

## 2020-07-28 ENCOUNTER — Other Ambulatory Visit: Payer: Self-pay | Admitting: Physician Assistant

## 2020-07-28 DIAGNOSIS — R0602 Shortness of breath: Secondary | ICD-10-CM

## 2020-09-19 ENCOUNTER — Encounter: Payer: 59 | Admitting: Osteopathic Medicine

## 2020-10-01 ENCOUNTER — Other Ambulatory Visit: Payer: Self-pay

## 2020-10-01 ENCOUNTER — Ambulatory Visit (INDEPENDENT_AMBULATORY_CARE_PROVIDER_SITE_OTHER): Payer: Managed Care, Other (non HMO) | Admitting: Osteopathic Medicine

## 2020-10-01 ENCOUNTER — Encounter: Payer: Self-pay | Admitting: Osteopathic Medicine

## 2020-10-01 VITALS — BP 130/82 | HR 77 | Temp 98.2°F | Wt 278.0 lb

## 2020-10-01 DIAGNOSIS — R7309 Other abnormal glucose: Secondary | ICD-10-CM | POA: Diagnosis not present

## 2020-10-01 DIAGNOSIS — Z0000 Encounter for general adult medical examination without abnormal findings: Secondary | ICD-10-CM

## 2020-10-01 DIAGNOSIS — R0602 Shortness of breath: Secondary | ICD-10-CM

## 2020-10-01 DIAGNOSIS — I10 Essential (primary) hypertension: Secondary | ICD-10-CM | POA: Diagnosis not present

## 2020-10-01 DIAGNOSIS — Z1211 Encounter for screening for malignant neoplasm of colon: Secondary | ICD-10-CM

## 2020-10-01 MED ORDER — ESCITALOPRAM OXALATE 20 MG PO TABS: 20.0000 mg | ORAL_TABLET | Freq: Every day | ORAL | 3 refills | Status: DC

## 2020-10-01 MED ORDER — LISINOPRIL-HYDROCHLOROTHIAZIDE 20-12.5 MG PO TABS: 1.0000 | ORAL_TABLET | Freq: Every day | ORAL | 3 refills | Status: DC

## 2020-10-01 MED ORDER — ESCITALOPRAM OXALATE 10 MG PO TABS: 10.0000 mg | ORAL_TABLET | Freq: Every day | ORAL | 0 refills | Status: DC

## 2020-10-01 NOTE — Patient Instructions (Addendum)
General Preventive Care  Most recent routine screening labs: ordered.   Blood pressure goal 130/80 or less.   Tobacco: don't!   Alcohol: responsible moderation is ok for most adults - if you have concerns about your alcohol intake, please talk to me!   Exercise: as tolerated to reduce risk of cardiovascular disease and diabetes. Strength training will also prevent osteoporosis.   Mental health: if need for mental health care (medicines, counseling, other), or concerns about moods, please let me know!   Sexual / Reproductive health: if need for STD testing, or if concerns with libido/pain problems, please let me know!   Advanced Directive: Living Will and/or Healthcare Power of Attorney recommended for all adults, regardless of age or health.  Vaccines  Flu vaccine: for almost everyone, every fall.   Shingles vaccine: after age 78.   Pneumonia vaccines: after age 55.  Tetanus booster: every 10 years - due 2025  COVID vaccine: THANKS for getting your vaccine! :) Cancer screenings   Colon cancer screening: for everyone age 14-75. Colonoscopy available for all, many people also qualify for the Cologuard stool test   Prostate cancer screening: PSA blood test age 67-71  Lung cancer screening: not needed for non-smokers  Infection screenings  . HIV: recommended screening at least once age 38-65 . Gonorrhea/Chlamydia: screening as needed . Hepatitis C: recommended once for everyone age 19-75 . TB: certain at-risk populations, or depending on work requirements and/or travel history Other . Bone Density Test: recommended for men at age 22

## 2020-10-01 NOTE — Progress Notes (Signed)
Jeffrey Orr. is a 50 y.o. male who presents to  [facility] at [facility]  today, 10/01/20, seeking care for the following:  . Annual physical      ASSESSMENT & PLAN with other pertinent findings:  The primary encounter diagnosis was Annual physical exam. Diagnoses of Essential hypertension, Elevated hemoglobin A1c measurement, Primary hypertension, SOB (shortness of breath), and Screen for colon cancer were also pertinent to this visit.    Patient Instructions  General Preventive Care  Most recent routine screening labs: ordered.   Blood pressure goal 130/80 or less.   Tobacco: don't!   Alcohol: responsible moderation is ok for most adults - if you have concerns about your alcohol intake, please talk to me!   Exercise: as tolerated to reduce risk of cardiovascular disease and diabetes. Strength training will also prevent osteoporosis.   Mental health: if need for mental health care (medicines, counseling, other), or concerns about moods, please let me know!   Sexual / Reproductive health: if need for STD testing, or if concerns with libido/pain problems, please let me know!   Advanced Directive: Living Will and/or Healthcare Power of Attorney recommended for all adults, regardless of age or health.  Vaccines  Flu vaccine: for almost everyone, every fall.   Shingles vaccine: after age 12.   Pneumonia vaccines: after age 73.  Tetanus booster: every 10 years - due 2025  COVID vaccine: THANKS for getting your vaccine! :) Cancer screenings   Colon cancer screening: for everyone age 66-75. Colonoscopy available for all, many people also qualify for the Cologuard stool test   Prostate cancer screening: PSA blood test age 40-71  Lung cancer screening: not needed for non-smokers  Infection screenings  . HIV: recommended screening at least once age 1-65 . Gonorrhea/Chlamydia: screening as needed . Hepatitis C:  recommended once for everyone age 46-75 . TB: certain at-risk populations, or depending on work requirements and/or travel history Other . Bone Density Test: recommended for men at age 33   Orders Placed This Encounter  Procedures  . CBC  . COMPLETE METABOLIC PANEL WITH GFR  . Lipid panel  . Hemoglobin A1c  . Ambulatory referral to Gastroenterology    Meds ordered this encounter  Medications  . escitalopram (LEXAPRO) 20 MG tablet    Sig: Take 1 tablet (20 mg total) by mouth daily. In addition to 10 mg tabletd for TDD 30 mg    Dispense:  90 tablet    Refill:  3  . lisinopril-hydrochlorothiazide (ZESTORETIC) 20-12.5 MG tablet    Sig: Take 1 tablet by mouth daily.    Dispense:  90 tablet    Refill:  3  . escitalopram (LEXAPRO) 10 MG tablet    Sig: Take 1 tablet (10 mg total) by mouth daily. In addition to 20 mg tablet for TDD 30 mg    Dispense:  90 tablet    Refill:  0     See below for relevant physical exam findings  See below for recent lab and imaging results reviewed  Medications, allergies, PMH, PSH, SocH, FamH reviewed below    Follow-up instructions: Return in about 1 year (around 10/01/2021) for [place] (call week prior to visit for lab orders).                                        Exam:  BP  130/82 (BP Location: Left Arm, Patient Position: Sitting, Cuff Size: Large)   Pulse 77   Temp 98.2 F (36.8 C) (Oral)   Wt 278 lb 0.6 oz (126.1 kg)   BMI 36.68 kg/m   Constitutional: VS see above. General Appearance: alert, well-developed, well-nourished, NAD  Neck: No masses, trachea midline.   Respiratory: Normal respiratory effort. no wheeze, no rhonchi, no rales  Cardiovascular: S1/S2 normal, no murmur, no rub/gallop auscultated. RRR.   Musculoskeletal: Gait normal. Symmetric and independent movement of all extremities  Abdominal: non-tender, non-distended, no appreciable organomegaly, neg Murphy's, BS WNLx4  Neurological:  Normal balance/coordination. No tremor.  Skin: warm, dry, intact.   Psychiatric: Normal judgment/insight. Normal mood and affect. Oriented x3.   Current Meds  Medication Sig  . albuterol (VENTOLIN HFA) 108 (90 Base) MCG/ACT inhaler Inhale 2 puffs into the lungs every 6 (six) hours as needed for wheezing or shortness of breath. appt for further refills  . escitalopram (LEXAPRO) 10 MG tablet Take 1 tablet (10 mg total) by mouth daily. In addition to 20 mg tablet for TDD 30 mg  . [DISCONTINUED] escitalopram (LEXAPRO) 20 MG tablet Take 1 tablet (20 mg total) by mouth daily.  . [DISCONTINUED] lisinopril-hydrochlorothiazide (ZESTORETIC) 20-12.5 MG tablet Take 1 tablet by mouth daily.    No Known Allergies  Patient Active Problem List   Diagnosis Date Noted  . Stress and adjustment reaction 07/03/2017  . Hypertension 04/13/2017  . Obesity (BMI 30-39.9) 04/13/2017    Family History  Problem Relation Age of Onset  . Cancer Father        bladder, mets, agent orange exposure   . Colon cancer Neg Hx   . Prostate cancer Neg Hx     Social History   Tobacco Use  Smoking Status Never Smoker  Smokeless Tobacco Never Used    No past surgical history on file.  Immunization History  Administered Date(s) Administered  . Moderna Sars-Covid-2 Vaccination 05/16/2020  . PFIZER(Purple Top)SARS-COV-2 Vaccination 10/10/2019, 10/31/2019  . Tdap 08/22/2013, 08/22/2013    Recent Results (from the past 2160 hour(s))  Novel Coronavirus, NAA ([facility])     Status: Abnormal   Collection Time: 07/11/20  8:40 AM   Specimen: Nasal Swab; Nasopharyngeal(NP) swabs in vial transport medium   Nasopharynge  Result Value Ref Range   SARS-CoV-2, NAA Detected (A) Not Detected    Comment: Patients who have a positive COVID-19 test result may now have treatment options. Treatment options are available for patients with mild to moderate symptoms and for hospitalized patients. Visit our website at  [email or web address] for resources and information. This nucleic acid amplification test was developed and its performance characteristics determined by [facility]. Nucleic acid amplification tests include RT-PCR and TMA. This test has not been FDA cleared or approved. This test has been authorized by FDA under an Emergency Use Authorization (EUA). This test is only authorized for the duration of time the declaration that circumstances exist justifying the authorization of the emergency use of in vitro diagnostic tests for detection of SARS-CoV-2 virus and/or diagnosis of COVID-19 infection under section 564(b)(1) of the Act, 21 U.S.C. 161WRU-0(A360bbb-3(b) (1), unless the authorization is terminated or revoked sooner. When diagnostic testing is negativ e, the possibility of a false negative result should be considered in the context of a patient's recent exposures and the presence of clinical signs and symptoms consistent with COVID-19. An individual without symptoms of COVID-19 and who is not shedding SARS-CoV-2 virus would expect to have a negative (not  detected) result in this assay.   SARS-COV-2, NAA 2 DAY TAT     Status: None   Collection Time: 07/11/20  8:40 AM   Nasopharynge  Result Value Ref Range   SARS-CoV-2, NAA 2 DAY TAT Performed   CBC     Status: Abnormal   Collection Time: 10/01/20  3:23 PM  Result Value Ref Range   WBC 7.3 3.8 - 10.8 Thousand/uL   RBC 4.67 4.20 - 5.80 Million/uL   Hemoglobin 15.6 13.2 - 17.1 g/dL   HCT 49.7 02.6 - 37.8 %   MCV 94.9 80.0 - 100.0 fL   MCH 33.4 (H) 27.0 - 33.0 pg   MCHC 35.2 32.0 - 36.0 g/dL   RDW 58.8 50.2 - 77.4 %   Platelets 199 140 - 400 Thousand/uL   MPV 9.8 7.5 - 12.5 fL  COMPLETE METABOLIC PANEL WITH GFR     Status: None   Collection Time: 10/01/20  3:23 PM  Result Value Ref Range   Glucose, Bld 87 65 - 99 mg/dL    Comment: .            Fasting reference interval .    BUN 12 7 - 25 mg/dL   Creat 1.28 7.86  - 7.67 mg/dL   GFR, Est Non African American 92 > OR = 60 mL/min/1.34m2   GFR, Est African American 107 > OR = 60 mL/min/1.69m2   BUN/Creatinine Ratio NOT APPLICABLE 6 - 22 (calc)   Sodium 139 135 - 146 mmol/L   Potassium 3.8 3.5 - 5.3 mmol/L   Chloride 103 98 - 110 mmol/L   CO2 26 20 - 32 mmol/L   Calcium 9.4 8.6 - 10.3 mg/dL   Total Protein 6.8 6.1 - 8.1 g/dL   Albumin 4.6 3.6 - 5.1 g/dL   Globulin 2.2 1.9 - 3.7 g/dL (calc)   AG Ratio 2.1 1.0 - 2.5 (calc)   Total Bilirubin 0.9 0.2 - 1.2 mg/dL   Alkaline phosphatase (APISO) 79 36 - 130 U/L   AST 18 10 - 40 U/L   ALT 23 9 - 46 U/L  Lipid panel     Status: Abnormal   Collection Time: 10/01/20  3:23 PM  Result Value Ref Range   Cholesterol 177 <200 mg/dL   HDL 36 (L) > OR = 40 mg/dL   Triglycerides 209 (H) <150 mg/dL    Comment: . If a non-fasting specimen was collected, consider repeat triglyceride testing on a fasting specimen if clinically indicated.  Perry Mount et al. J. of Clin. Lipidol. 2015;9:129-169. Marland Kitchen    LDL Cholesterol (Calc) 101 (H) mg/dL (calc)    Comment: Reference range: <100 . Desirable range <100 mg/dL for primary prevention;   <70 mg/dL for patients with CHD or diabetic patients  with > or = 2 CHD risk factors. Marland Kitchen LDL-C is now calculated using the Martin-Hopkins  calculation, which is a validated novel method providing  better accuracy than the Friedewald equation in the  estimation of LDL-C.  Horald Pollen et al. Lenox Ahr. 4709;628(36): 2061-2068  (http://education.[facility].com/faq/FAQ164)    Total CHOL/HDL Ratio 4.9 <5.0 (calc)   Non-HDL Cholesterol (Calc) 141 (H) <130 mg/dL (calc)    Comment: For patients with diabetes plus 1 major ASCVD risk  factor, treating to a non-HDL-C goal of <100 mg/dL  (LDL-C of <62 mg/dL) is considered a therapeutic  option.   Hemoglobin A1c     Status: None   Collection Time: 10/01/20  3:23 PM  Result Value Ref Range  Hgb A1c MFr Bld 5.5 <5.7 % of total Hgb    Comment:  For the purpose of screening for the presence of diabetes: . <5.7%       Consistent with the absence of diabetes 5.7-6.4%    Consistent with increased risk for diabetes             (prediabetes) > or =6.5%  Consistent with diabetes . This assay result is consistent with a decreased risk of diabetes. . Currently, no consensus exists regarding use of hemoglobin A1c for diagnosis of diabetes in children. . According to American Diabetes Association (ADA) guidelines, hemoglobin A1c <7.0% represents optimal control in non-pregnant diabetic patients. Different metrics may apply to specific patient populations.  Standards of Medical Care in Diabetes(ADA). .    Mean Plasma Glucose 111 mg/dL   eAG (mmol/L) 6.2 mmol/L    No results found.     All questions at time of visit were answered - patient instructed to contact office with any additional concerns or updates. ER/RTC precautions were reviewed with the patient as applicable.   Please note: manual typing as well as voice recognition software may have been used to produce this document - typos may escape review. Please contact Dr. Lyn Hollingshead for any needed clarifications.

## 2020-10-02 LAB — COMPLETE METABOLIC PANEL WITH GFR
AG Ratio: 2.1 (calc) (ref 1.0–2.5)
ALT: 23 U/L (ref 9–46)
AST: 18 U/L (ref 10–40)
Albumin: 4.6 g/dL (ref 3.6–5.1)
Alkaline phosphatase (APISO): 79 U/L (ref 36–130)
BUN: 12 mg/dL (ref 7–25)
CO2: 26 mmol/L (ref 20–32)
Calcium: 9.4 mg/dL (ref 8.6–10.3)
Chloride: 103 mmol/L (ref 98–110)
Creat: 0.96 mg/dL (ref 0.60–1.35)
GFR, Est African American: 107 mL/min/{1.73_m2} (ref >=60)
GFR, Est Non African American: 92 mL/min/{1.73_m2} (ref >=60)
Globulin: 2.2 g/dL (calc) (ref 1.9–3.7)
Glucose, Bld: 87 mg/dL (ref 65–99)
Potassium: 3.8 mmol/L (ref 3.5–5.3)
Sodium: 139 mmol/L (ref 135–146)
Total Bilirubin: 0.9 mg/dL (ref 0.2–1.2)
Total Protein: 6.8 g/dL (ref 6.1–8.1)

## 2020-10-02 LAB — CBC
HCT: 44.3 % (ref 38.5–50.0)
Hemoglobin: 15.6 g/dL (ref 13.2–17.1)
MCH: 33.4 pg — ABNORMAL HIGH (ref 27.0–33.0)
MCHC: 35.2 g/dL (ref 32.0–36.0)
MCV: 94.9 fL (ref 80.0–100.0)
MPV: 9.8 fL (ref 7.5–12.5)
Platelets: 199 10*3/uL (ref 140–400)
RBC: 4.67 10*6/uL (ref 4.20–5.80)
RDW: 12.4 % (ref 11.0–15.0)
WBC: 7.3 10*3/uL (ref 3.8–10.8)

## 2020-10-02 LAB — LIPID PANEL
Cholesterol: 177 mg/dL (ref <200)
HDL: 36 mg/dL — ABNORMAL LOW (ref >=40)
LDL Cholesterol (Calc): 101 mg/dL (calc) — ABNORMAL HIGH
Non-HDL Cholesterol (Calc): 141 mg/dL (calc) — ABNORMAL HIGH (ref <130)
Total CHOL/HDL Ratio: 4.9 (calc) (ref <5.0)
Triglycerides: 280 mg/dL — ABNORMAL HIGH (ref <150)

## 2020-10-02 LAB — HEMOGLOBIN A1C
Hgb A1c MFr Bld: 5.5 % of total Hgb (ref <5.7)
Mean Plasma Glucose: 111 mg/dL
eAG (mmol/L): 6.2 mmol/L

## 2020-10-03 ENCOUNTER — Other Ambulatory Visit: Payer: Self-pay

## 2020-10-03 ENCOUNTER — Ambulatory Visit: Payer: Managed Care, Other (non HMO) | Admitting: Sports Medicine

## 2020-10-03 ENCOUNTER — Ambulatory Visit (INDEPENDENT_AMBULATORY_CARE_PROVIDER_SITE_OTHER): Payer: Managed Care, Other (non HMO)

## 2020-10-03 DIAGNOSIS — M25561 Pain in right knee: Secondary | ICD-10-CM | POA: Diagnosis not present

## 2020-10-03 DIAGNOSIS — Z09 Encounter for follow-up examination after completed treatment for conditions other than malignant neoplasm: Secondary | ICD-10-CM | POA: Diagnosis not present

## 2020-10-03 DIAGNOSIS — M25461 Effusion, right knee: Secondary | ICD-10-CM

## 2020-10-03 NOTE — Progress Notes (Addendum)
    Procedures performed today:    Procedure: Real-time Ultrasound Guided  aspiration/injection of right knee Device: Samsung HS60  Verbal informed consent obtained.  Time-out conducted.  Noted no overlying erythema, induration, or other signs of local infection.  Skin prepped in a sterile fashion.  Local anesthesia: Topical Ethyl chloride.  With sterile technique and under real time ultrasound guidance:  Noted effusion, using 18-gauge needle I advanced into the suprapatellar recess, aspirated approximately 12 mL of clear, straw-colored fluid, syringe switched and 1 cc Kenalog 40, 2 cc lidocaine, 2 cc bupivacaine injected easily Completed without difficulty  Advised to call if fevers/chills, erythema, induration, drainage, or persistent bleeding.  Images permanently stored and available for review in PACS.  Impression: Technically successful ultrasound guided injection.  Independent interpretation of notes and tests performed by another provider:   None.  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 He does have an effusion and some tenderness at the medial joint line today, swelling is too extensive to do a full McMurray's sign. We aspirated and injected his knee today, adding x-rays, home rehab exercises, return to see me in a month, MRI for operative planning if no better.    ___________________________________________ Jeffrey Orr. [name], M.D., ABFM., CAQSM. Primary Care and Sports Medicine [place] [facility]  Adjunct Instructor of Family Medicine  [employer]

## 2020-10-03 NOTE — Assessment & Plan Note (Signed)
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 He does have an effusion and some tenderness at the medial joint line today, swelling is too extensive to do a full McMurray's sign. We aspirated and injected his knee today, adding x-rays, home rehab exercises, return to see me in a month, MRI for operative planning if no better.

## 2020-10-29 LAB — HM COLONOSCOPY

## 2020-10-31 ENCOUNTER — Ambulatory Visit (INDEPENDENT_AMBULATORY_CARE_PROVIDER_SITE_OTHER): Payer: Managed Care, Other (non HMO) | Admitting: Sports Medicine

## 2020-10-31 DIAGNOSIS — M25461 Effusion, right knee: Secondary | ICD-10-CM | POA: Diagnosis not present

## 2020-10-31 MED ORDER — MELOXICAM 15 MG PO TABS: ORAL_TABLET | ORAL | 3 refills | Status: DC

## 2020-10-31 NOTE — Progress Notes (Signed)
    Procedures performed today:    None.  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  Brief History, Exam, Impression, and Recommendations:    Swelling of joint, knee, right This is a pleasant 50 year old male, he returns for follow-up of his knee, we did an aspiration and injection at the last visit. He returns today, he is still a little bit puffy and swollen but he has no pain, no mechanical symptoms in his happy with how things are going, we are going to add some meloxicam, he will ice the knee after long days and continue to wear knee sleeve, and return to see me on an as-needed basis.    ___________________________________________ Ihor Austin. [name], M.D., ABFM., CAQSM. Primary Care and Sports Medicine  [facility]  Adjunct Instructor of Family Medicine  [employer]

## 2020-10-31 NOTE — Assessment & Plan Note (Signed)
This is a pleasant 50 year old male, he returns for follow-up of his knee, we did an aspiration and injection at the last visit. He returns today, he is still a little bit puffy and swollen but he has no pain, no mechanical symptoms in his happy with how things are going, we are going to add some meloxicam, he will ice the knee after long days and continue to wear knee sleeve, and return to see me on an as-needed basis.

## 2020-12-04 ENCOUNTER — Encounter: Payer: Self-pay | Admitting: Osteopathic Medicine

## 2020-12-26 ENCOUNTER — Other Ambulatory Visit: Payer: Self-pay

## 2020-12-26 MED ORDER — ESCITALOPRAM OXALATE 10 MG PO TABS: 10.0000 mg | ORAL_TABLET | Freq: Every day | ORAL | 1 refills | Status: DC

## 2021-02-26 ENCOUNTER — Other Ambulatory Visit: Payer: Self-pay | Admitting: Sports Medicine

## 2021-02-26 DIAGNOSIS — M25461 Effusion, right knee: Secondary | ICD-10-CM

## 2021-04-29 ENCOUNTER — Other Ambulatory Visit: Payer: Self-pay | Admitting: Osteopathic Medicine

## 2021-05-27 ENCOUNTER — Other Ambulatory Visit: Payer: Self-pay | Admitting: Sports Medicine

## 2021-05-27 DIAGNOSIS — M25461 Effusion, right knee: Secondary | ICD-10-CM

## 2021-07-18 ENCOUNTER — Other Ambulatory Visit: Payer: Self-pay | Admitting: Sports Medicine

## 2021-07-18 DIAGNOSIS — M25461 Effusion, right knee: Secondary | ICD-10-CM

## 2021-09-06 ENCOUNTER — Other Ambulatory Visit: Payer: Self-pay | Admitting: Osteopathic Medicine

## 2021-09-14 ENCOUNTER — Other Ambulatory Visit: Payer: Self-pay | Admitting: Sports Medicine

## 2021-09-14 DIAGNOSIS — M25461 Effusion, right knee: Secondary | ICD-10-CM

## 2021-09-20 ENCOUNTER — Other Ambulatory Visit: Payer: Self-pay | Admitting: Family Medicine

## 2021-09-20 ENCOUNTER — Other Ambulatory Visit: Payer: Self-pay | Admitting: Osteopathic Medicine

## 2021-10-01 ENCOUNTER — Other Ambulatory Visit: Payer: Self-pay | Admitting: Medical-Surgical

## 2021-10-03 MED ORDER — ESCITALOPRAM OXALATE 20 MG PO TABS: 20.0000 mg | ORAL_TABLET | Freq: Every day | ORAL | 0 refills | Status: DC

## 2021-10-15 ENCOUNTER — Other Ambulatory Visit: Payer: Self-pay | Admitting: Medical-Surgical

## 2021-10-19 ENCOUNTER — Other Ambulatory Visit: Payer: Self-pay | Admitting: Medical-Surgical

## 2021-10-25 ENCOUNTER — Other Ambulatory Visit: Payer: Self-pay | Admitting: Sports Medicine

## 2021-11-07 ENCOUNTER — Other Ambulatory Visit: Payer: Self-pay | Admitting: Medical-Surgical

## 2021-11-29 ENCOUNTER — Other Ambulatory Visit: Payer: Self-pay | Admitting: Family Medicine

## 2021-12-06 ENCOUNTER — Ambulatory Visit: Payer: Managed Care, Other (non HMO) | Admitting: Family Medicine

## 2021-12-06 ENCOUNTER — Encounter: Payer: Self-pay | Admitting: Family Medicine

## 2021-12-06 ENCOUNTER — Encounter: Payer: Managed Care, Other (non HMO) | Admitting: Family Medicine

## 2021-12-06 VITALS — BP 112/72 | HR 57 | Ht 73.0 in | Wt 276.1 lb

## 2021-12-06 DIAGNOSIS — Z0000 Encounter for general adult medical examination without abnormal findings: Secondary | ICD-10-CM

## 2021-12-06 DIAGNOSIS — Z13220 Encounter for screening for lipoid disorders: Secondary | ICD-10-CM

## 2021-12-06 DIAGNOSIS — I10 Essential (primary) hypertension: Secondary | ICD-10-CM

## 2021-12-06 MED ORDER — ESCITALOPRAM OXALATE 10 MG PO TABS: ORAL_TABLET | ORAL | 3 refills | Status: DC

## 2021-12-06 MED ORDER — ESCITALOPRAM OXALATE 20 MG PO TABS
ORAL_TABLET | ORAL | 3 refills | Status: DC
Start: 2021-12-06 — End: 2022-03-06

## 2021-12-06 NOTE — Patient Instructions (Signed)

## 2021-12-06 NOTE — Assessment & Plan Note (Signed)
BP is well controlled at this time.  Continue current medications for management of HTN.   

## 2021-12-06 NOTE — Assessment & Plan Note (Signed)
Well adult Orders Placed This Encounter  Procedures  . COMPLETE METABOLIC PANEL WITH GFR  . CBC with Differential  . Lipid Panel w/reflex Direct LDL  . TSH  . HgB A1c  Screening: Per lab orders. Immunizations;  Declines shingrix at this time.  Anticipatory guidance/Risk factor reduction:  Recommendations per AVS.

## 2021-12-06 NOTE — Progress Notes (Signed)
Jeffrey Orr. - 51 y.o. male MRN [id number]  Date of birth: 02-22-1971  Subjective Chief Complaint  Patient presents with   Transitions Of Care    HPI Jeffrey Orr. Is a 51 y.o. male here today for annual exam.  Reports that he is doing well at this time.  No new concerns today.  Chronic conditions are well controlled with current medications.  No side effects.   He feels like he stays very active.  He is working on dietary changes for weight loss.   He is a non-smoker.  He rarely consumes EtOH.   Colonoscopy is up to date.   He is not interested in Shingles vaccine  Review of Systems  Constitutional:  Negative for chills, fever, malaise/fatigue and weight loss.  HENT:  Negative for congestion, ear pain and sore throat.   Eyes:  Negative for blurred vision, double vision and pain.  Respiratory:  Negative for cough and shortness of breath.   Cardiovascular:  Negative for chest pain and palpitations.  Gastrointestinal:  Negative for abdominal pain, blood in stool, constipation, heartburn and nausea.  Genitourinary:  Negative for dysuria and urgency.  Musculoskeletal:  Negative for joint pain and myalgias.  Neurological:  Negative for dizziness and headaches.  Endo/Heme/Allergies:  Does not bruise/bleed easily.  Psychiatric/Behavioral:  Negative for depression. The patient is not nervous/anxious and does not have insomnia.     No Known Allergies  Past Medical History:  Diagnosis Date   Anxiety    High blood pressure     History reviewed. No pertinent surgical history.  Social History   Socioeconomic History   Marital status: Married    Spouse name: Not on file   Number of children: Not on file   Years of education: Not on file   Highest education level: Not on file  Occupational History    Employer: Mining engineer  Tobacco Use   Smoking status: Never   Smokeless tobacco: Never  Vaping Use   Vaping Use: Never used  Substance and  Sexual Activity   Alcohol use: Not Currently   Drug use: Never   Sexual activity: Yes    Partners: Female  Other Topics Concern   Not on file  Social History Narrative   Not on file   Social Determinants of Health   Financial Resource Strain: Not on file  Food Insecurity: Not on file  Transportation Needs: Not on file  Physical Activity: Not on file  Stress: Not on file  Social Connections: Not on file    Family History  Problem Relation Age of Onset   Cancer Father        bladder, mets, agent orange exposure    Colon cancer Neg Hx    Prostate cancer Neg Hx     Health Maintenance  Topic Date Due   COVID-19 Vaccine (4 - Pfizer series) 12/22/2021 (Originally 07/11/2020)   Zoster Vaccines- Shingrix (1 of 2) 03/08/2022 (Originally 01/16/2021)   Hepatitis C Screening  12/07/2022 (Originally 01/16/1989)   HIV Screening  12/07/2022 (Originally 01/16/1986)   INFLUENZA VACCINE  01/28/2022   TETANUS/TDAP  08/23/2023   COLONOSCOPY (Pts 45-30yrs Insurance coverage will need to be confirmed)  10/30/2027   HPV VACCINES  Aged Out     ----------------------------------------------------------------------------------------------------------------------------------------------------------------------------------------------------------------- Physical Exam BP 112/72 (BP Location: Left Arm, Patient Position: Sitting, Cuff Size: Large)   Pulse (!) 57   Ht 6\' 1"  (1.854 m)   Wt 276 lb 1.9 oz (125.2 kg)  SpO2 95%   BMI 36.43 kg/m   Physical Exam Constitutional:      General: He is not in acute distress. HENT:     Head: Normocephalic and atraumatic.     Right Ear: Tympanic membrane and external ear normal.     Left Ear: Tympanic membrane and external ear normal.  Eyes:     General: No scleral icterus. Neck:     Thyroid: No thyromegaly.  Cardiovascular:     Rate and Rhythm: Normal rate and regular rhythm.     Heart sounds: Normal heart sounds.  Pulmonary:     Effort: Pulmonary  effort is normal.     Breath sounds: Normal breath sounds.  Abdominal:     General: Bowel sounds are normal. There is no distension.     Palpations: Abdomen is soft.     Tenderness: There is no abdominal tenderness. There is no guarding.  Musculoskeletal:     Cervical back: Normal range of motion.  Lymphadenopathy:     Cervical: No cervical adenopathy.  Skin:    General: Skin is warm and dry.     Findings: No rash.  Neurological:     Mental Status: He is alert and oriented to person, place, and time.     Cranial Nerves: No cranial nerve deficit.     Motor: No abnormal muscle tone.  Psychiatric:        Mood and Affect: Mood normal.        Behavior: Behavior normal.     ------------------------------------------------------------------------------------------------------------------------------------------------------------------------------------------------------------------- Assessment and Plan  Hypertension BP is well controlled at this time.  Continue current medications for management of HTN.    Well adult exam Well adult Orders Placed This Encounter  Procedures   COMPLETE METABOLIC PANEL WITH GFR   CBC with Differential   Lipid Panel w/reflex Direct LDL   TSH   HgB A1c  Screening: Per lab orders. Immunizations;  Declines shingrix at this time.  Anticipatory guidance/Risk factor reduction:  Recommendations per AVS.    Meds ordered this encounter  Medications   escitalopram (LEXAPRO) 10 MG tablet    Sig: TAKE 1 TAB BY MOUTH DAILY IN ADDITION TO 20MG  FOR A TOTAL OF 30 MG    Dispense:  90 tablet    Refill:  3   escitalopram (LEXAPRO) 20 MG tablet    Sig: TAKE 1 TABLET BY MOUTH DAILY. IN ADDITION TO 10 MG FOR 30MG  DAILY    Dispense:  90 tablet    Refill:  3    No follow-ups on file.    This visit occurred during the SARS-CoV-2 public health emergency.  Safety protocols were in place, including screening questions prior to the visit, additional usage of staff  PPE, and extensive cleaning of exam room while observing appropriate contact time as indicated for disinfecting solutions.

## 2021-12-07 LAB — LIPID PANEL W/REFLEX DIRECT LDL
Cholesterol: 172 mg/dL (ref <200)
HDL: 38 mg/dL — ABNORMAL LOW (ref >=40)
LDL Cholesterol (Calc): 106 mg/dL (calc) — ABNORMAL HIGH
Non-HDL Cholesterol (Calc): 134 mg/dL (calc) — ABNORMAL HIGH (ref <130)
Total CHOL/HDL Ratio: 4.5 (calc) (ref <5.0)
Triglycerides: 164 mg/dL — ABNORMAL HIGH (ref <150)

## 2021-12-07 LAB — COMPLETE METABOLIC PANEL WITH GFR
AG Ratio: 2.1 (calc) (ref 1.0–2.5)
ALT: 24 U/L (ref 9–46)
AST: 16 U/L (ref 10–35)
Albumin: 4.5 g/dL (ref 3.6–5.1)
Alkaline phosphatase (APISO): 73 U/L (ref 35–144)
BUN: 12 mg/dL (ref 7–25)
CO2: 28 mmol/L (ref 20–32)
Calcium: 9.4 mg/dL (ref 8.6–10.3)
Chloride: 103 mmol/L (ref 98–110)
Creat: 1 mg/dL (ref 0.70–1.30)
Globulin: 2.1 g/dL (calc) (ref 1.9–3.7)
Glucose, Bld: 104 mg/dL — ABNORMAL HIGH (ref 65–99)
Potassium: 4.4 mmol/L (ref 3.5–5.3)
Sodium: 138 mmol/L (ref 135–146)
Total Bilirubin: 1.3 mg/dL — ABNORMAL HIGH (ref 0.2–1.2)
Total Protein: 6.6 g/dL (ref 6.1–8.1)
eGFR: 92 mL/min/{1.73_m2} (ref >=60)

## 2021-12-07 LAB — HEMOGLOBIN A1C
Hgb A1c MFr Bld: 5.5 % of total Hgb (ref <5.7)
Mean Plasma Glucose: 111 mg/dL
eAG (mmol/L): 6.2 mmol/L

## 2021-12-07 LAB — TSH: TSH: 1.53 mIU/L (ref 0.40–4.50)

## 2021-12-07 LAB — CBC WITH DIFFERENTIAL/PLATELET
Absolute Monocytes: 435 cells/uL (ref 200–950)
Basophils Absolute: 22 cells/uL (ref 0–200)
Basophils Relative: 0.4 %
Eosinophils Absolute: 154 cells/uL (ref 15–500)
Eosinophils Relative: 2.8 %
HCT: 45.8 % (ref 38.5–50.0)
Hemoglobin: 15.7 g/dL (ref 13.2–17.1)
Lymphs Abs: 1936 cells/uL (ref 850–3900)
MCH: 32.5 pg (ref 27.0–33.0)
MCHC: 34.3 g/dL (ref 32.0–36.0)
MCV: 94.8 fL (ref 80.0–100.0)
MPV: 9.5 fL (ref 7.5–12.5)
Monocytes Relative: 7.9 %
Neutro Abs: 2954 cells/uL (ref 1500–7800)
Neutrophils Relative %: 53.7 %
Platelets: 191 10*3/uL (ref 140–400)
RBC: 4.83 10*6/uL (ref 4.20–5.80)
RDW: 12.2 % (ref 11.0–15.0)
Total Lymphocyte: 35.2 %
WBC: 5.5 10*3/uL (ref 3.8–10.8)

## 2021-12-14 ENCOUNTER — Other Ambulatory Visit: Payer: Self-pay | Admitting: Sports Medicine

## 2021-12-14 DIAGNOSIS — M25461 Effusion, right knee: Secondary | ICD-10-CM

## 2022-03-05 ENCOUNTER — Other Ambulatory Visit: Payer: Self-pay | Admitting: Family Medicine

## 2022-03-05 ENCOUNTER — Other Ambulatory Visit: Payer: Self-pay | Admitting: Sports Medicine

## 2022-03-05 DIAGNOSIS — M25461 Effusion, right knee: Secondary | ICD-10-CM

## 2022-06-01 ENCOUNTER — Other Ambulatory Visit: Payer: Self-pay | Admitting: Family Medicine

## 2022-06-01 DIAGNOSIS — M25461 Effusion, right knee: Secondary | ICD-10-CM

## 2022-06-02 ENCOUNTER — Other Ambulatory Visit: Payer: Self-pay | Admitting: Family Medicine

## 2022-06-06 ENCOUNTER — Ambulatory Visit: Payer: Managed Care, Other (non HMO) | Admitting: Family Medicine

## 2022-06-06 ENCOUNTER — Encounter: Payer: Self-pay | Admitting: Family Medicine

## 2022-06-06 VITALS — BP 119/75 | HR 66 | Ht 73.0 in | Wt 275.0 lb

## 2022-06-06 DIAGNOSIS — R17 Unspecified jaundice: Secondary | ICD-10-CM

## 2022-06-06 DIAGNOSIS — I10 Essential (primary) hypertension: Secondary | ICD-10-CM | POA: Diagnosis not present

## 2022-06-06 DIAGNOSIS — F411 Generalized anxiety disorder: Secondary | ICD-10-CM | POA: Diagnosis not present

## 2022-06-06 DIAGNOSIS — M25461 Effusion, right knee: Secondary | ICD-10-CM | POA: Diagnosis not present

## 2022-06-06 NOTE — Assessment & Plan Note (Signed)
Stable at this time. He will continue PRN meloxicam

## 2022-06-06 NOTE — Assessment & Plan Note (Signed)
Continue lexapro at current strength.  

## 2022-06-06 NOTE — Progress Notes (Signed)
Jeffrey Orr. - 51 y.o. male MRN [id number]  Date of birth: [birth year]  Subjective Chief Complaint  Patient presents with   Follow-up    HPI Jeffrey Orr. Is a 51 y.o. male here today for follow up visit.   Reports that he is doing well.  Continues on lisinopril/hctz for management of HTN.  He is tolerating medications well at current strength. BP is well controlled.  Working on dietary changes but having difficulty with weight loss. Not exercising very much.  Walks a lot at work. Denies chest pain, shortness of breath, palpitations, headache or vision changes.   Anxiety is well controlled.   He is on 30mg  lexapro daily.  No side effects at current strength.    Knees are doing pretty good.  Using meloxicam as needed.   ROS:  A comprehensive ROS was completed and negative except as noted per HPI  No Known Allergies  Past Medical History:  Diagnosis Date   Anxiety    High blood pressure     History reviewed. No pertinent surgical history.  Social History   Socioeconomic History   Marital status: Married    Spouse name: Not on file   Number of children: Not on file   Years of education: Not on file   Highest education level: Not on file  Occupational History    Employer:  Tobacco Use   Smoking status: Never   Smokeless tobacco: Never  Vaping Use   Vaping Use: Never used  Substance and Sexual Activity   Alcohol use: Not Currently   Drug use: Never   Sexual activity: Yes    Partners: Female  Other Topics Concern   Not on file  Social History Narrative   Not on file   Social Determinants of Health   Financial Resource Strain: Not on file  Food Insecurity: Not on file  Transportation Needs: Not on file  Physical Activity: Not on file  Stress: Not on file  Social Connections: Not on file    Family History  Problem Relation Age of Onset   Cancer Father        bladder, mets, agent orange exposure    Colon cancer  Neg Hx    Prostate cancer Neg Hx     Health Maintenance  Topic Date Due   COVID-19 Vaccine (4 - 2023-24 season) 07/31/2022 (Originally 02/28/2022)   Zoster Vaccines- Shingrix (1 of 2) 09/05/2022 (Originally 01/16/2021)   INFLUENZA VACCINE  09/28/2022 (Originally 01/28/2022)   Hepatitis C Screening  12/07/2022 (Originally 01/16/1989)   HIV Screening  12/07/2022 (Originally 01/16/1986)   DTaP/Tdap/Td (3 - Td or Tdap) 08/23/2023   COLONOSCOPY (Pts 45-76yrs Insurance coverage will need to be confirmed)  10/30/2027   HPV VACCINES  Aged Out     ----------------------------------------------------------------------------------------------------------------------------------------------------------------------------------------------------------------- Physical Exam BP 119/75 (BP Location: Left Arm, Patient Position: Sitting, Cuff Size: Large)   Pulse 66   Ht 6\' 1"  (1.854 m)   Wt 275 lb (124.7 kg)   SpO2 97%   BMI 36.28 kg/m   Physical Exam Constitutional:      Appearance: Normal appearance.  HENT:     Head: Normocephalic and atraumatic.  Eyes:     General: No scleral icterus. Cardiovascular:     Rate and Rhythm: Normal rate and regular rhythm.  Pulmonary:     Effort: Pulmonary effort is normal.     Breath sounds: Normal breath sounds.  Musculoskeletal:     Cervical back: Neck supple.  Neurological:     Mental Status: He is alert.  Psychiatric:        Mood and Affect: Mood normal.        Behavior: Behavior normal.     ------------------------------------------------------------------------------------------------------------------------------------------------------------------------------------------------------------------- Assessment and Plan  Hypertension Bp is well controlled.  Continue current medication.  Encouraged continued dietary change and incorporation of exercise.   Swelling of joint, knee, right Stable at this time. He will continue PRN meloxicam  Anxiety  state Continue lexapro at current strength.     No orders of the defined types were placed in this encounter.   No follow-ups on file.    This visit occurred during the SARS-CoV-2 public health emergency.  Safety protocols were in place, including screening questions prior to the visit, additional usage of staff PPE, and extensive cleaning of exam room while observing appropriate contact time as indicated for disinfecting solutions.

## 2022-06-06 NOTE — Assessment & Plan Note (Signed)
Bp is well controlled.  Continue current medication.  Encouraged continued dietary change and incorporation of exercise.

## 2022-06-07 LAB — HEPATIC FUNCTION PANEL
AG Ratio: 2.2 (calc) (ref 1.0–2.5)
ALT: 22 U/L (ref 9–46)
AST: 16 U/L (ref 10–35)
Albumin: 4.6 g/dL (ref 3.6–5.1)
Alkaline phosphatase (APISO): 76 U/L (ref 35–144)
Bilirubin, Direct: 0.2 mg/dL (ref 0.0–0.2)
Globulin: 2.1 g/dL (calc) (ref 1.9–3.7)
Indirect Bilirubin: 1.1 mg/dL (calc) (ref 0.2–1.2)
Total Bilirubin: 1.3 mg/dL — ABNORMAL HIGH (ref 0.2–1.2)
Total Protein: 6.7 g/dL (ref 6.1–8.1)

## 2022-06-10 ENCOUNTER — Encounter: Payer: Self-pay | Admitting: Family Medicine

## 2022-06-10 DIAGNOSIS — R17 Unspecified jaundice: Secondary | ICD-10-CM

## 2022-06-16 ENCOUNTER — Ambulatory Visit (INDEPENDENT_AMBULATORY_CARE_PROVIDER_SITE_OTHER): Payer: Managed Care, Other (non HMO)

## 2022-06-16 DIAGNOSIS — R17 Unspecified jaundice: Secondary | ICD-10-CM

## 2022-08-29 ENCOUNTER — Other Ambulatory Visit: Payer: Self-pay | Admitting: Family Medicine

## 2022-08-30 ENCOUNTER — Other Ambulatory Visit: Payer: Self-pay | Admitting: Family Medicine

## 2022-08-30 DIAGNOSIS — M25461 Effusion, right knee: Secondary | ICD-10-CM

## 2022-09-27 IMAGING — DX DG KNEE 1-2V*L*
2 series · 2 of 2 positions shown · non-contrast
Comparison: None.

CLINICAL DATA: Twisted right knee few months ago, pain and swelling

EXAM:
RIGHT KNEE - COMPLETE 4+ VIEW; LEFT KNEE - 1-2 VIEW

[tunnel]
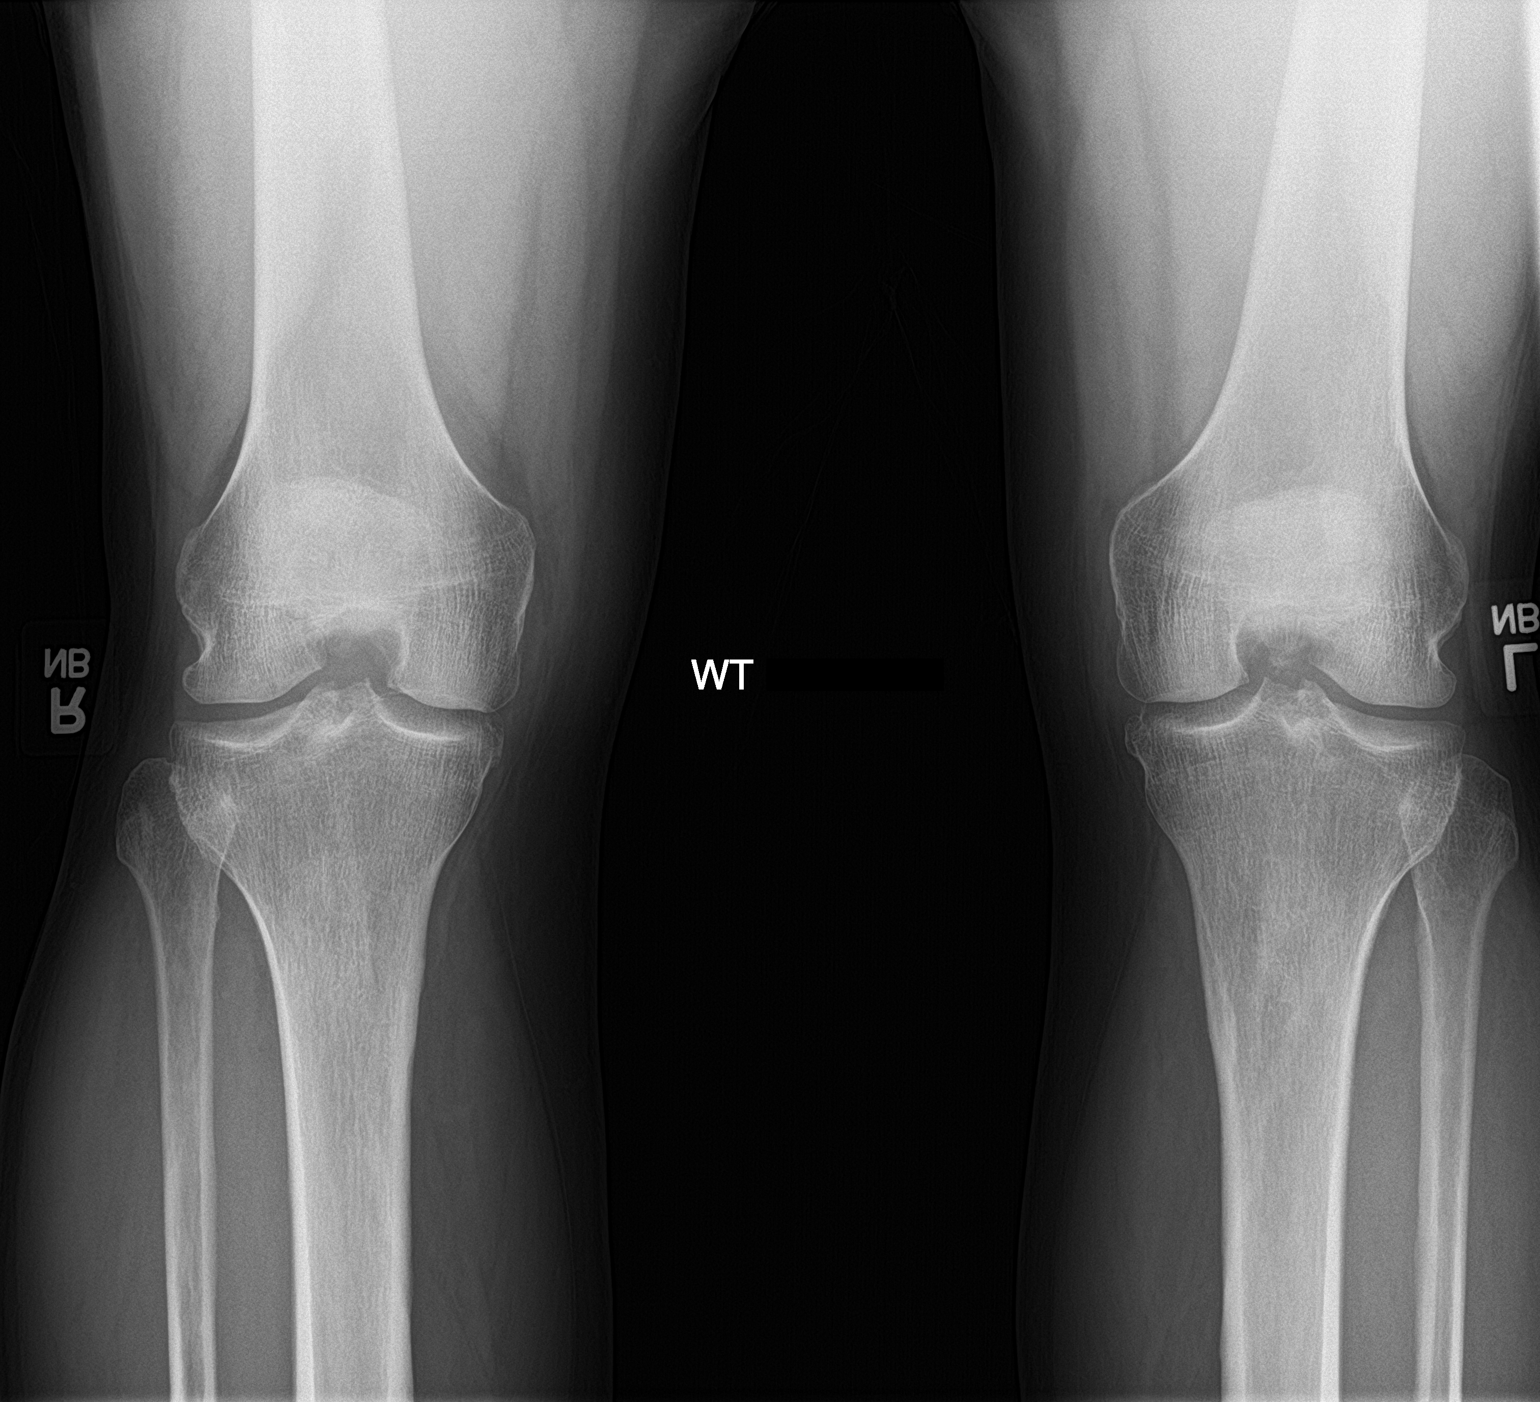

[knee ap bilat standing]
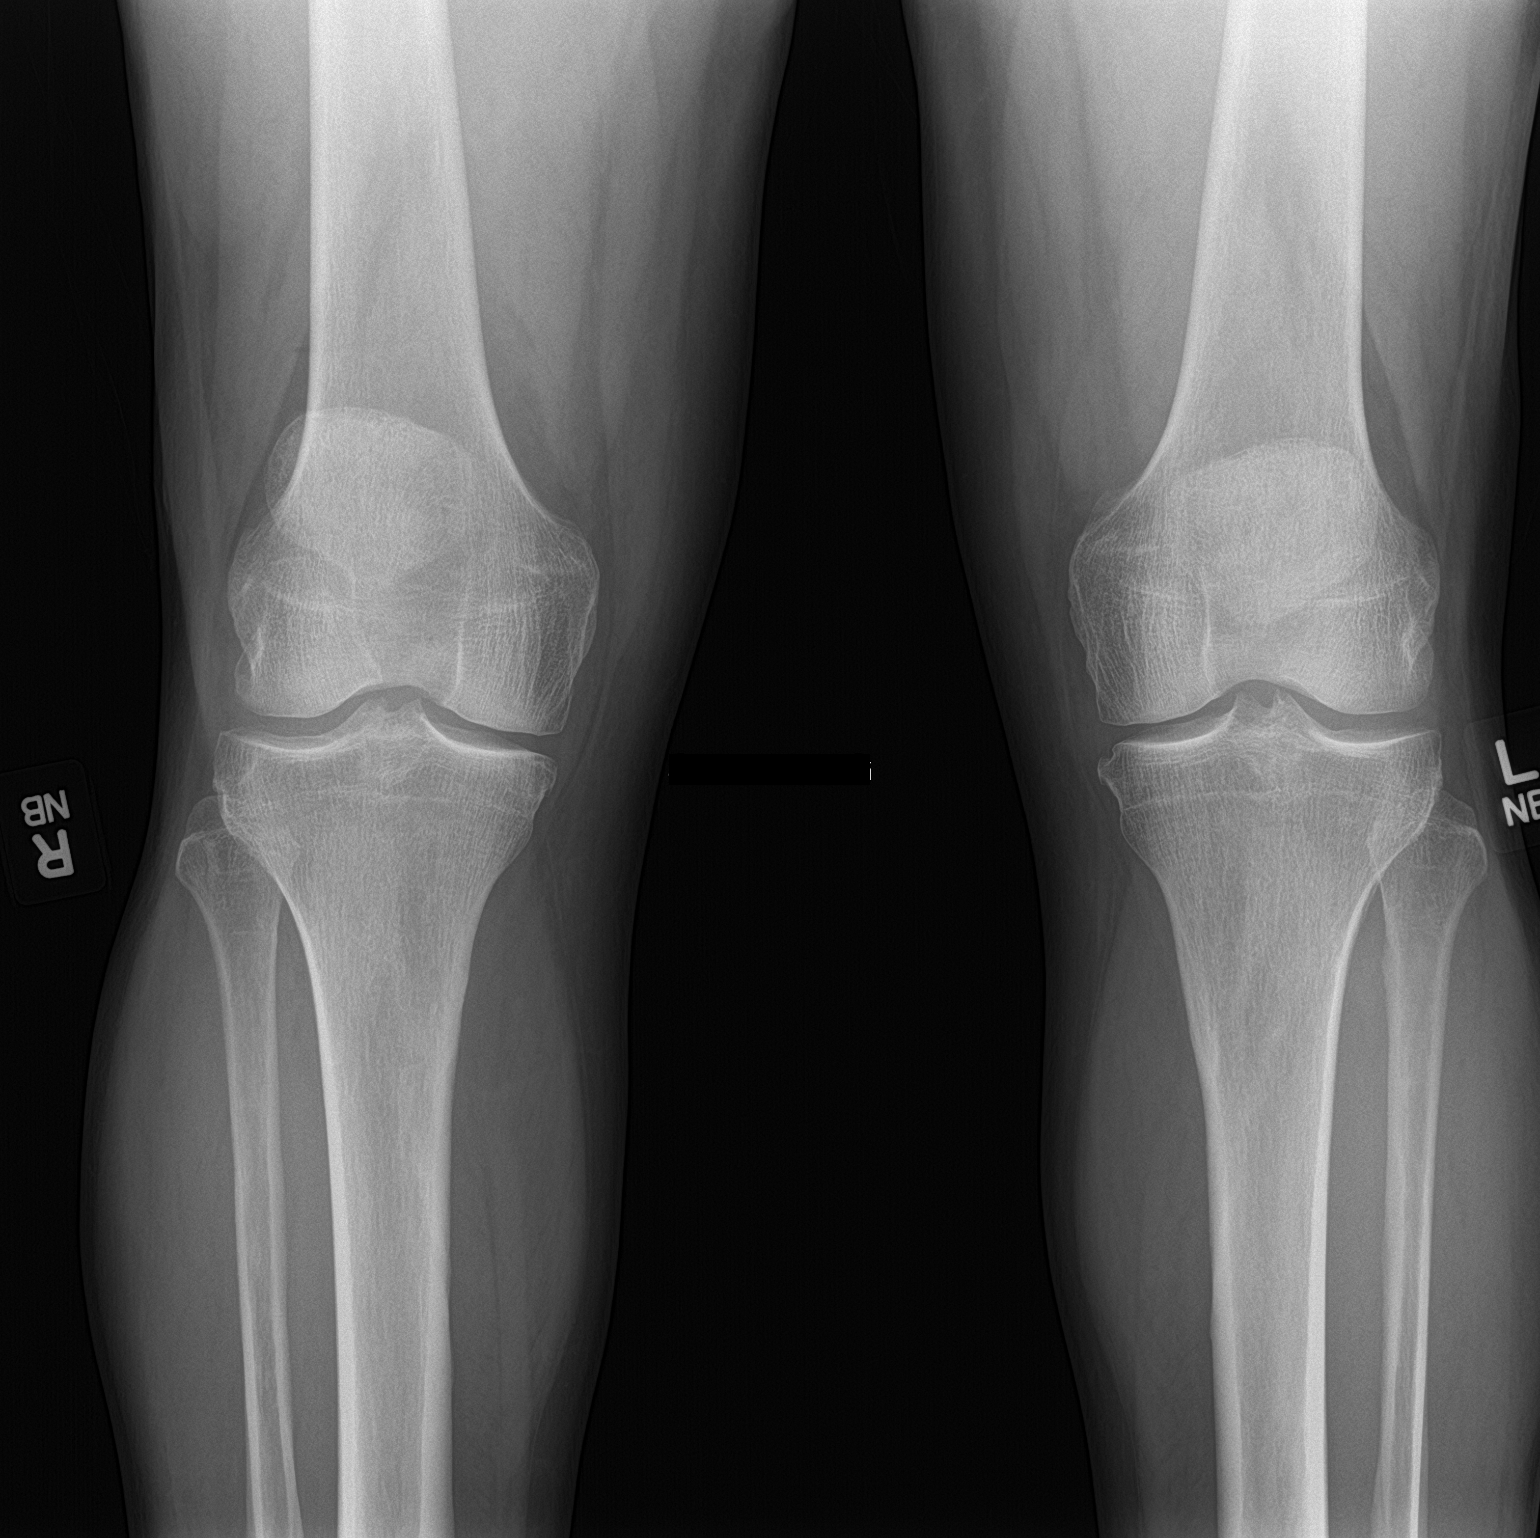

[2 of 2 positions shown; findings below may reference images not displayed]

FINDINGS: Alignment is anatomic. Right joint effusion. Joint spaces are
preserved. No intrinsic osseous lesion.
IMPRESSION: Right joint effusion.  No significant osseous abnormality.

## 2022-11-26 ENCOUNTER — Other Ambulatory Visit: Payer: Self-pay | Admitting: Family Medicine

## 2022-11-27 ENCOUNTER — Other Ambulatory Visit: Payer: Self-pay | Admitting: Family Medicine

## 2022-11-27 NOTE — Telephone Encounter (Signed)
Patient is already scheduled for an appointment for June 16th

## 2022-12-11 ENCOUNTER — Other Ambulatory Visit: Payer: Self-pay | Admitting: Family Medicine

## 2022-12-11 DIAGNOSIS — M25461 Effusion, right knee: Secondary | ICD-10-CM

## 2022-12-12 ENCOUNTER — Ambulatory Visit (INDEPENDENT_AMBULATORY_CARE_PROVIDER_SITE_OTHER): Payer: Managed Care, Other (non HMO) | Admitting: Family Medicine

## 2022-12-12 ENCOUNTER — Encounter: Payer: Self-pay | Admitting: Family Medicine

## 2022-12-12 VITALS — BP 131/79 | HR 67 | Ht 73.0 in | Wt 274.0 lb

## 2022-12-12 DIAGNOSIS — Z125 Encounter for screening for malignant neoplasm of prostate: Secondary | ICD-10-CM | POA: Diagnosis not present

## 2022-12-12 DIAGNOSIS — Z0000 Encounter for general adult medical examination without abnormal findings: Secondary | ICD-10-CM | POA: Diagnosis not present

## 2022-12-12 DIAGNOSIS — I10 Essential (primary) hypertension: Secondary | ICD-10-CM | POA: Diagnosis not present

## 2022-12-12 DIAGNOSIS — R7309 Other abnormal glucose: Secondary | ICD-10-CM | POA: Diagnosis not present

## 2022-12-12 DIAGNOSIS — H6593 Unspecified nonsuppurative otitis media, bilateral: Secondary | ICD-10-CM

## 2022-12-12 NOTE — Progress Notes (Signed)
Jeffrey Orr. - 52 y.o. male MRN [id number]  Date of birth: Mar 12, 1971  Subjective Chief Complaint  Patient presents with   Annual Exam    HPI Jeffrey Orr. Is a 52 y.o. male here today for annual exam.   Overall he is doing well.  His ears feel clogged.  Noted to have effusion/ETD at recent urgent care visit. Given prednisone.   Has had some issues with carpal tunnel.  He doesn't think that he wants to pursue any treatment at this time.   He remains pretty active.  He feels that diet is pretty good most of the time.  BP remains well controlled with current medications.   He is a non-smoker.  No EtOH at this time.   Declines shingrix.   UTD on colon cancer screening.   Review of Systems  Constitutional:  Negative for chills, fever, malaise/fatigue and weight loss.  HENT:  Negative for congestion, ear pain and sore throat.   Eyes:  Negative for blurred vision, double vision and pain.  Respiratory:  Negative for cough and shortness of breath.   Cardiovascular:  Negative for chest pain and palpitations.  Gastrointestinal:  Negative for abdominal pain, blood in stool, constipation, heartburn and nausea.  Genitourinary:  Negative for dysuria and urgency.  Musculoskeletal:  Negative for joint pain and myalgias.  Neurological:  Negative for dizziness and headaches.  Endo/Heme/Allergies:  Does not bruise/bleed easily.  Psychiatric/Behavioral:  Negative for depression. The patient is not nervous/anxious and does not have insomnia.      No Known Allergies  Past Medical History:  Diagnosis Date   Anxiety    High blood pressure     History reviewed. No pertinent surgical history.  Social History   Socioeconomic History   Marital status: Married    Spouse name: Not on file   Number of children: Not on file   Years of education: Not on file   Highest education level: Not on file  Occupational History    Employer: Mining engineer  Tobacco Use    Smoking status: Never   Smokeless tobacco: Never  Vaping Use   Vaping Use: Never used  Substance and Sexual Activity   Alcohol use: Not Currently   Drug use: Never   Sexual activity: Yes    Partners: Female  Other Topics Concern   Not on file  Social History Narrative   Not on file   Social Determinants of Health   Financial Resource Strain: Not on file  Food Insecurity: Not on file  Transportation Needs: Not on file  Physical Activity: Not on file  Stress: Not on file  Social Connections: Not on file    Family History  Problem Relation Age of Onset   Cancer Father        bladder, mets, agent orange exposure    Colon cancer Neg Hx    Prostate cancer Neg Hx     Health Maintenance  Topic Date Due   HIV Screening  Never done   Hepatitis C Screening  Never done   COVID-19 Vaccine (4 - 2023-24 season) 12/28/2022 (Originally 02/28/2022)   Zoster Vaccines- Shingrix (1 of 2) 03/14/2023 (Originally 01/16/2021)   INFLUENZA VACCINE  01/29/2023   DTaP/Tdap/Td (3 - Td or Tdap) 08/23/2023   Colonoscopy  10/30/2027   HPV VACCINES  Aged Out     ----------------------------------------------------------------------------------------------------------------------------------------------------------------------------------------------------------------- Physical Exam BP 131/79 (BP Location: Left Arm, Patient Position: Sitting, Cuff Size: Normal)   Pulse 67  Ht 6\' 1"  (1.854 m)   Wt 274 lb (124.3 kg)   SpO2 97%   BMI 36.15 kg/m   Physical Exam Constitutional:      General: He is not in acute distress. HENT:     Head: Normocephalic and atraumatic.     Right Ear: External ear normal.     Left Ear: External ear normal.     Ears:     Comments: Middle ear effusion b/l Eyes:     General: No scleral icterus. Neck:     Thyroid: No thyromegaly.  Cardiovascular:     Rate and Rhythm: Normal rate and regular rhythm.     Heart sounds: Normal heart sounds.  Pulmonary:      Effort: Pulmonary effort is normal.     Breath sounds: Normal breath sounds.  Abdominal:     General: Bowel sounds are normal. There is no distension.     Palpations: Abdomen is soft.     Tenderness: There is no abdominal tenderness. There is no guarding.  Musculoskeletal:     Cervical back: Normal range of motion.  Lymphadenopathy:     Cervical: No cervical adenopathy.  Skin:    General: Skin is warm and dry.     Findings: No rash.  Neurological:     Mental Status: He is alert and oriented to person, place, and time.     Cranial Nerves: No cranial nerve deficit.     Motor: No abnormal muscle tone.  Psychiatric:        Mood and Affect: Mood normal.        Behavior: Behavior normal.     ------------------------------------------------------------------------------------------------------------------------------------------------------------------------------------------------------------------- Assessment and Plan  Well adult exam Well adult Orders Placed This Encounter  Procedures   COMPLETE METABOLIC PANEL WITH GFR   CBC with Differential   Lipid Panel w/reflex Direct LDL   TSH   HgB A1c   PSA   Ambulatory referral to ENT    Referral Priority:   Routine    Referral Type:   Consultation    Referral Reason:   Specialty Services Required    Requested Specialty:   Otolaryngology    Number of Visits Requested:   1  Screenings: per lab orders Immunizations:  declines shingrix Anticipatory guidance/risk factor reduction:  recommendations per AVS   No orders of the defined types were placed in this encounter.   No follow-ups on file.    This visit occurred during the SARS-CoV-2 public health emergency.  Safety protocols were in place, including screening questions prior to the visit, additional usage of staff PPE, and extensive cleaning of exam room while observing appropriate contact time as indicated for disinfecting solutions.

## 2022-12-12 NOTE — Assessment & Plan Note (Signed)
Well adult Orders Placed This Encounter  Procedures   COMPLETE METABOLIC PANEL WITH GFR   CBC with Differential   Lipid Panel w/reflex Direct LDL   TSH   HgB A1c   PSA   Ambulatory referral to ENT    Referral Priority:   Routine    Referral Type:   Consultation    Referral Reason:   Specialty Services Required    Requested Specialty:   Otolaryngology    Number of Visits Requested:   1  Screenings: per lab orders Immunizations:  declines shingrix Anticipatory guidance/risk factor reduction:  recommendations per AVS

## 2022-12-12 NOTE — Patient Instructions (Signed)

## 2022-12-13 LAB — LIPID PANEL W/REFLEX DIRECT LDL
Cholesterol: 175 mg/dL (ref <200)
HDL: 50 mg/dL (ref >=40)
LDL Cholesterol (Calc): 109 mg/dL (calc) — ABNORMAL HIGH
Non-HDL Cholesterol (Calc): 125 mg/dL (calc) (ref <130)
Total CHOL/HDL Ratio: 3.5 (calc) (ref <5.0)
Triglycerides: 73 mg/dL (ref <150)

## 2022-12-13 LAB — COMPLETE METABOLIC PANEL WITH GFR
AG Ratio: 2 (calc) (ref 1.0–2.5)
ALT: 19 U/L (ref 9–46)
AST: 12 U/L (ref 10–35)
Albumin: 4.3 g/dL (ref 3.6–5.1)
Alkaline phosphatase (APISO): 68 U/L (ref 35–144)
BUN: 14 mg/dL (ref 7–25)
CO2: 21 mmol/L (ref 20–32)
Calcium: 9.3 mg/dL (ref 8.6–10.3)
Chloride: 105 mmol/L (ref 98–110)
Creat: 0.93 mg/dL (ref 0.70–1.30)
Globulin: 2.2 g/dL (calc) (ref 1.9–3.7)
Glucose, Bld: 131 mg/dL — ABNORMAL HIGH (ref 65–99)
Potassium: 4 mmol/L (ref 3.5–5.3)
Sodium: 138 mmol/L (ref 135–146)
Total Bilirubin: 0.9 mg/dL (ref 0.2–1.2)
Total Protein: 6.5 g/dL (ref 6.1–8.1)
eGFR: 99 mL/min/{1.73_m2} (ref >=60)

## 2022-12-13 LAB — CBC WITH DIFFERENTIAL/PLATELET
Absolute Monocytes: 263 cells/uL (ref 200–950)
Basophils Absolute: 20 cells/uL (ref 0–200)
Basophils Relative: 0.2 %
Eosinophils Absolute: 0 cells/uL — ABNORMAL LOW (ref 15–500)
Eosinophils Relative: 0 %
HCT: 42 % (ref 38.5–50.0)
Hemoglobin: 14.6 g/dL (ref 13.2–17.1)
Lymphs Abs: 1141 cells/uL (ref 850–3900)
MCH: 32.3 pg (ref 27.0–33.0)
MCHC: 34.8 g/dL (ref 32.0–36.0)
MCV: 92.9 fL (ref 80.0–100.0)
MPV: 9.5 fL (ref 7.5–12.5)
Monocytes Relative: 2.6 %
Neutro Abs: 8676 cells/uL — ABNORMAL HIGH (ref 1500–7800)
Neutrophils Relative %: 85.9 %
Platelets: 214 10*3/uL (ref 140–400)
RBC: 4.52 10*6/uL (ref 4.20–5.80)
RDW: 12.3 % (ref 11.0–15.0)
Total Lymphocyte: 11.3 %
WBC: 10.1 10*3/uL (ref 3.8–10.8)

## 2022-12-13 LAB — HEMOGLOBIN A1C
Hgb A1c MFr Bld: 5.8 % of total Hgb — ABNORMAL HIGH (ref <5.7)
Mean Plasma Glucose: 120 mg/dL
eAG (mmol/L): 6.6 mmol/L

## 2022-12-13 LAB — TSH: TSH: 1.16 mIU/L (ref 0.40–4.50)

## 2022-12-13 LAB — PSA: PSA: 0.64 ng/mL (ref <=4.00)

## 2022-12-20 ENCOUNTER — Other Ambulatory Visit: Payer: Self-pay | Admitting: Family Medicine

## 2023-03-11 ENCOUNTER — Other Ambulatory Visit: Payer: Self-pay | Admitting: Family Medicine

## 2023-03-11 DIAGNOSIS — M25461 Effusion, right knee: Secondary | ICD-10-CM

## 2023-06-20 ENCOUNTER — Other Ambulatory Visit: Payer: Self-pay | Admitting: Family Medicine

## 2023-07-06 ENCOUNTER — Encounter: Payer: Self-pay | Admitting: Family Medicine

## 2023-07-06 ENCOUNTER — Ambulatory Visit: Payer: Managed Care, Other (non HMO) | Admitting: Family Medicine

## 2023-07-06 VITALS — BP 108/70 | HR 73 | Ht 73.0 in | Wt 286.1 lb

## 2023-07-06 DIAGNOSIS — R519 Headache, unspecified: Secondary | ICD-10-CM | POA: Insufficient documentation

## 2023-07-06 DIAGNOSIS — R4189 Other symptoms and signs involving cognitive functions and awareness: Secondary | ICD-10-CM

## 2023-07-06 NOTE — Progress Notes (Signed)
 Jeffrey Orr. - 53 y.o. male MRN [id number]  Date of birth: 04-02-1971  Subjective Chief Complaint  Patient presents with   Headache   memory issues    HPI Jeffrey Orr. Is a 53 y.o. male here today with complaint of new onset of headaches.  He has had associated nausea and foggy thinking/cognitive changes with this as well.  Seen at urgent care last week and had labs which were unremarkable.  He has been checking BP and glucose at home and these have looked ok.  Feels pressure in the front of his head.  Vision is blurred at times.  He has seen his optometrist and eye exam was ok.  He has had tinnitus for a few months, evaluated by ENT previously. He denies weakness, numbness or tingling in the extremities.  ROS:  A comprehensive ROS was completed and negative except as noted per HPI  No Known Allergies  Past Medical History:  Diagnosis Date   Anxiety    High blood pressure     History reviewed. No pertinent surgical history.  Social History   Socioeconomic History   Marital status: Married    Spouse name: Not on file   Number of children: Not on file   Years of education: Not on file   Highest education level: 12th grade  Occupational History    Employer: Mining Engineer  Tobacco Use   Smoking status: Never   Smokeless tobacco: Never  Vaping Use   Vaping status: Never Used  Substance and Sexual Activity   Alcohol use: Not Currently   Drug use: Never   Sexual activity: Yes    Partners: Female  Other Topics Concern   Not on file  Social History Narrative   Not on file   Social Drivers of Health   Financial Resource Strain: Low Risk  (07/02/2023)   Overall Financial Resource Strain (CARDIA)    Difficulty of Paying Living Expenses: Not hard at all  Food Insecurity: No Food Insecurity (07/02/2023)   Hunger Vital Sign    Worried About Running Out of Food in the Last Year: Never true    Ran Out of Food in the Last Year: Never true   Transportation Needs: No Transportation Needs (07/02/2023)   PRAPARE - Administrator, Civil Service (Medical): No    Lack of Transportation (Non-Medical): No  Physical Activity: Sufficiently Active (07/02/2023)   Exercise Vital Sign    Days of Exercise per Week: 5 days    Minutes of Exercise per Session: 30 min  Recent Concern: Physical Activity - Insufficiently Active (04/28/2023)   [facility] from [facility]   Exercise Vital Sign    Days of Exercise per Week: 3 days    Minutes of Exercise per Session: 40 min  Stress: No Stress Concern Present (07/02/2023)   [employer] of Occupational Health - Occupational Stress Questionnaire    Feeling of Stress : Only a little  Social Connections: Moderately Isolated (07/02/2023)   Social Connection and Isolation Panel [NHANES]    Frequency of Communication with Friends and Family: Three times a week    Frequency of Social Gatherings with Friends and Family: Once a week    Attends Religious Services: Never    Database Administrator or Organizations: No    Attends Engineer, Structural: Not on file    Marital Status: Married    Family History  Problem Relation Age of Onset   Cancer Father  bladder, mets, agent orange exposure    Colon cancer Neg Hx    Prostate cancer Neg Hx     Health Maintenance  Topic Date Due   HIV Screening  Never done   Hepatitis C Screening  Never done   Zoster Vaccines- Shingrix (1 of 2) Never done   COVID-19 Vaccine (4 - 2024-25 season) 03/01/2023   INFLUENZA VACCINE  09/28/2023 (Originally 01/29/2023)   DTaP/Tdap/Td (3 - Td or Tdap) 08/23/2023   Colonoscopy  10/30/2027   HPV VACCINES  Aged Out     ----------------------------------------------------------------------------------------------------------------------------------------------------------------------------------------------------------------- Physical Exam BP 108/70 (BP Location: Left Arm, Patient Position: Sitting,  Cuff Size: Large)   Pulse 73   Ht 6' 1 (1.854 m)   Wt 286 lb 1.6 oz (129.8 kg)   SpO2 96%   BMI 37.75 kg/m   Physical Exam Constitutional:      Appearance: Normal appearance.  HENT:     Head: Normocephalic and atraumatic.     Right Ear: Tympanic membrane and ear canal normal.     Left Ear: Tympanic membrane and ear canal normal.  Neck:     Comments: No carotid bruits noted.  Cardiovascular:     Rate and Rhythm: Normal rate and regular rhythm.  Pulmonary:     Effort: Pulmonary effort is normal.     Breath sounds: Normal breath sounds.  Neurological:     General: No focal deficit present.     Mental Status: He is alert and oriented to person, place, and time. Mental status is at baseline.     Cranial Nerves: No cranial nerve deficit.     Motor: No weakness.     Gait: Gait normal.     ------------------------------------------------------------------------------------------------------------------------------------------------------------------------------------------------------------------- Assessment and Plan  New onset of headaches He has had new onset of headaches.  Has had some tinnitus and vision changes along with foggy thinking from this.  Labs at urgent care are unremarkable.  MRI of brain ordered givne new onset of headaches with some neurological changes.    No orders of the defined types were placed in this encounter.   No follow-ups on file.    This visit occurred during the SARS-CoV-2 public health emergency.  Safety protocols were in place, including screening questions prior to the visit, additional usage of staff PPE, and extensive cleaning of exam room while observing appropriate contact time as indicated for disinfecting solutions.

## 2023-07-06 NOTE — Assessment & Plan Note (Signed)
 He has had new onset of headaches.  Has had some tinnitus and vision changes along with foggy thinking from this.  Labs at urgent care are unremarkable.  MRI of brain ordered givne new onset of headaches with some neurological changes.

## 2023-07-08 ENCOUNTER — Telehealth: Payer: Self-pay

## 2023-07-08 NOTE — Telephone Encounter (Signed)
 Copied from CRM [id number]. Topic: Clinical - Request for Lab/Test Order >> Jul 08, 2023  2:21 PM Nila Nephew wrote: Reason for CRM: Patient calling in to request an update on authorization of MRI.

## 2023-07-10 ENCOUNTER — Telehealth: Payer: Self-pay

## 2023-07-10 NOTE — Telephone Encounter (Signed)
 Patient is wanting to know if the prior authorization has been done for the MRI.

## 2023-07-10 NOTE — Telephone Encounter (Signed)
 Copied from CRM [id number]. Topic: Referral - Status >> Jul 09, 2023 11:19 AM Laurier BROCKS wrote:  Reason for CRM:  Jeffrey Orr [id number] is calling to get the status of her husband's referral for an MRI. [employer] needs prior auth for MRI of the brain [phone]

## 2023-07-10 NOTE — Telephone Encounter (Signed)
 Copied from CRM [id number]. Topic: Referral - Status >> Jul 09, 2023  3:04 PM Joesph PARAS wrote: Reason for CRM: Patient calling in to state that he has heard nothing regarding his imaging still. Documentation in chart suggests that it does not go to clinic, nor to central referral team. Inquiring for clarity. Patient expressing frustration that nothing continues to be done.

## 2023-07-13 NOTE — Telephone Encounter (Signed)
 Copied from CRM [id number]. Topic: Referral - Question >> Jul 13, 2023  3:50 PM Fonda Kinder J wrote: Reason for CRM: Pt's insurance called in stating that pt  needs an order for MR Brain Wo Contrast sent over to [facility] Fax: [phone]

## 2023-07-15 NOTE — Telephone Encounter (Signed)
 This is not a [facility] patient.  Please route.

## 2023-07-15 NOTE — Telephone Encounter (Signed)
 This has previously been taken care of. Thanks!

## 2023-07-30 ENCOUNTER — Encounter: Payer: Self-pay | Admitting: Family Medicine

## 2023-09-23 ENCOUNTER — Other Ambulatory Visit: Payer: Self-pay | Admitting: Family Medicine

## 2023-12-20 ENCOUNTER — Other Ambulatory Visit: Payer: Self-pay | Admitting: Family Medicine

## 2023-12-21 NOTE — Telephone Encounter (Signed)
 Pls contact pt to schedule HTN follow-up appt with Dr. Alvia. Due in July. Thx.

## 2023-12-22 NOTE — Telephone Encounter (Signed)
 Called patient left vm to call back and schedule appt for July

## 2024-03-01 ENCOUNTER — Encounter: Payer: Self-pay | Admitting: Sports Medicine

## 2024-03-18 ENCOUNTER — Other Ambulatory Visit: Payer: Self-pay | Admitting: Family Medicine
